# Patient Record
Sex: Female | Born: 1985 | Race: Black or African American | Hispanic: No | Marital: Single | Smoking: Never smoker
Health system: Southern US, Community
[De-identification: ages and names within clinical notes are randomized; demographics above are authoritative.]

## PROBLEM LIST (undated history)

## (undated) ENCOUNTER — Inpatient Hospital Stay (HOSPITAL_COMMUNITY): Payer: Self-pay

## (undated) DIAGNOSIS — Z789 Other specified health status: Secondary | ICD-10-CM

## (undated) HISTORY — PX: NO PAST SURGERIES: SHX2092

---

## 2007-05-27 ENCOUNTER — Inpatient Hospital Stay (HOSPITAL_COMMUNITY): Admission: AD | Admit: 2007-05-27 | Discharge: 2007-05-27 | Payer: Self-pay | Admitting: Obstetrics

## 2007-06-20 ENCOUNTER — Inpatient Hospital Stay (HOSPITAL_COMMUNITY): Admission: AD | Admit: 2007-06-20 | Discharge: 2007-06-20 | Payer: Self-pay | Admitting: Obstetrics

## 2007-07-26 ENCOUNTER — Inpatient Hospital Stay (HOSPITAL_COMMUNITY): Admission: RE | Admit: 2007-07-26 | Discharge: 2007-07-28 | Payer: Self-pay | Admitting: Obstetrics

## 2007-12-16 ENCOUNTER — Emergency Department (HOSPITAL_COMMUNITY): Admission: EM | Admit: 2007-12-16 | Discharge: 2007-12-16 | Payer: Self-pay | Admitting: Family Medicine

## 2008-01-23 ENCOUNTER — Emergency Department (HOSPITAL_COMMUNITY): Admission: EM | Admit: 2008-01-23 | Discharge: 2008-01-23 | Payer: Self-pay | Admitting: Emergency Medicine

## 2008-01-27 ENCOUNTER — Emergency Department (HOSPITAL_COMMUNITY): Admission: EM | Admit: 2008-01-27 | Discharge: 2008-01-27 | Payer: Self-pay | Admitting: Emergency Medicine

## 2011-01-06 ENCOUNTER — Inpatient Hospital Stay (INDEPENDENT_AMBULATORY_CARE_PROVIDER_SITE_OTHER)
Admission: RE | Admit: 2011-01-06 | Discharge: 2011-01-06 | Disposition: A | Discharge status: Home / Self Care | Payer: Self-pay | Source: Ambulatory Visit | Attending: Family Medicine | Admitting: Family Medicine

## 2011-01-06 DIAGNOSIS — H00019 Hordeolum externum unspecified eye, unspecified eyelid: Secondary | ICD-10-CM

## 2011-02-27 LAB — URINALYSIS, ROUTINE W REFLEX MICROSCOPIC
Ketones, ur: NEGATIVE
Nitrite: NEGATIVE
Specific Gravity, Urine: >1.03 — ABNORMAL HIGH

## 2011-02-28 LAB — CBC
HCT: 34.9 — ABNORMAL LOW
Hemoglobin: 12.1
Platelets: 184
RBC: 4.03
RDW: 14.3
RDW: 14.6
WBC: 10
WBC: 10.9 — ABNORMAL HIGH

## 2011-02-28 LAB — RPR: RPR Ser Ql: NONREACTIVE

## 2011-03-14 LAB — URINALYSIS, ROUTINE W REFLEX MICROSCOPIC
Bilirubin Urine: NEGATIVE
Protein, ur: NEGATIVE
pH: 7.5

## 2011-03-14 LAB — DIFFERENTIAL
Basophils Relative: 1
Lymphs Abs: 1.6
Monocytes Absolute: 0.9
Monocytes Relative: 9
Neutro Abs: 6.7

## 2011-03-14 LAB — CBC
HCT: 31.6 — ABNORMAL LOW
MCV: 89
Platelets: 188
RDW: 14.9

## 2012-07-18 DIAGNOSIS — O2390 Unspecified genitourinary tract infection in pregnancy, unspecified trimester: Secondary | ICD-10-CM | POA: Insufficient documentation

## 2012-07-18 DIAGNOSIS — O9989 Other specified diseases and conditions complicating pregnancy, childbirth and the puerperium: Secondary | ICD-10-CM | POA: Insufficient documentation

## 2012-07-18 DIAGNOSIS — O039 Complete or unspecified spontaneous abortion without complication: Secondary | ICD-10-CM | POA: Insufficient documentation

## 2012-07-18 DIAGNOSIS — R109 Unspecified abdominal pain: Secondary | ICD-10-CM | POA: Insufficient documentation

## 2012-07-18 DIAGNOSIS — A599 Trichomoniasis, unspecified: Secondary | ICD-10-CM | POA: Insufficient documentation

## 2012-07-19 ENCOUNTER — Emergency Department (HOSPITAL_COMMUNITY)
Admission: EM | Admit: 2012-07-19 | Discharge: 2012-07-19 | Disposition: A | Discharge status: Home / Self Care | Payer: Managed Care, Other (non HMO) | Attending: Emergency Medicine | Admitting: Emergency Medicine

## 2012-07-19 DIAGNOSIS — A599 Trichomoniasis, unspecified: Secondary | ICD-10-CM

## 2012-07-19 LAB — COMPREHENSIVE METABOLIC PANEL
ALT: 8 U/L (ref 0–35)
BUN: 8 mg/dL (ref 6–23)
Chloride: 103 mEq/L (ref 96–112)
Creatinine, Ser: 0.76 mg/dL (ref 0.50–1.10)
GFR calc non Af Amer: >90 mL/min (ref >90)
Glucose, Bld: 106 mg/dL — ABNORMAL HIGH (ref 70–99)
Sodium: 136 mEq/L (ref 135–145)
Total Bilirubin: 0.2 mg/dL — ABNORMAL LOW (ref 0.3–1.2)
Total Protein: 7.3 g/dL (ref 6.0–8.3)

## 2012-07-19 LAB — WET PREP, GENITAL

## 2012-07-19 LAB — CBC WITH DIFFERENTIAL/PLATELET
Eosinophils Absolute: 0.2 10*3/uL (ref 0.0–0.7)
HCT: 38.8 % (ref 36.0–46.0)
Lymphs Abs: 2.8 10*3/uL (ref 0.7–4.0)
MCHC: 34 g/dL (ref 30.0–36.0)
MCV: 82 fL (ref 78.0–100.0)
Monocytes Relative: 8 % (ref 3–12)
Neutro Abs: 6.3 10*3/uL (ref 1.7–7.7)

## 2012-07-19 LAB — URINALYSIS, ROUTINE W REFLEX MICROSCOPIC: Protein, ur: 30 mg/dL — AB

## 2012-07-19 LAB — URINE MICROSCOPIC-ADD ON

## 2012-07-19 LAB — LIPASE, BLOOD: Lipase: 24 U/L (ref 11–59)

## 2012-07-19 MED ORDER — METRONIDAZOLE 500 MG PO TABS: 500.0000 mg | ORAL_TABLET | Freq: Two times a day (BID) | ORAL | Status: DC

## 2012-07-19 NOTE — ED Notes (Signed)
Pelvic cart set up 

## 2012-07-19 NOTE — Discharge Instructions (Signed)
Incomplete Miscarriage Miscarriages in pregnancy are common. A miscarriage is a pregnancy that has ended before the twentieth week. You have had an incomplete miscarriage. Partial parts of the fetus or placenta (afterbirth) remain behind. Sometimes further treatment is needed. The most common reason for further treatment is continued bleeding (hemorrhage). Tissue left behind may also become infected. Treatment usually is curettage. Curettage for an incomplete abortion is a procedure in which the remaining products of pregnancy are removed. This can be done by a simple sucking procedure (suction curettage). It can also be done by a simple scraping (curettage) of the inside of the uterus (womb). This may be done in the hospital or in the caregiver's office. This is only done when your caregiver knows the pregnancy has ended. This is determined by physical examination and a negative pregnancy test. It may also include an ultrasound to confirm a dead fetus. The ultrasound may also prove that products of the pregnancy remain in the uterus. If your cervix remains dilated and you are still passing clots and tissue, your caregiver may wish to watch you for a little while. Your caregiver may want to see if you are going to finish passing all of the remaining parts of the pregnancy. If the bleeding continues, they may proceed with curettage. WHY DO I FEEL THIS WAY Miscarriages can be a very emotional time for prospective mothers. This is not you or your partner's fault. The miscarriage did not occur because of a lack in you or your partner. Nearly all miscarriages occur because the pregnancy has started off wrongly. At least half of miscarried pregnancies have a chromosomal abnormality (almost always not inherited). Others may have developmental problems with the fetus or placentas. Problems may not show up even when the products miscarried are studied under the microscope. You can usually begin trying for another  pregnancy as soon as your caregiver says it's okay. HOME CARE INSTRUCTIONS   Your caregiver may order bed rest (this means only getting up to use the bathroom). Your caregiver may allow you to continue light activity. If curettage was not done at this time, but you require further treatment.  Keep track of the number of pads you use each day. Keep track of how saturated (soaked) they are. Record this information.  Do not use tampons. Do not douche or have sexual intercourse until approved by your caregiver.  It is very important to keep all follow-up appointments for re-evaluation and continuing management.  Women who have an Rh negative blood type (ie, A, B, AB, or O negative) need to receive a drug called Rh(D) immune globulin. This medicine helps protect future fetuses against problems that can occur if an Rh negative mother is carrying a baby who is Rh positive. SEEK IMMEDIATE MEDICAL CARE IF:   You experience severe cramps in your stomach, back, or abdomen.  You run an unexplained temperature (record these).  You pass large clots or tissue (save any tissue for your caregiver to inspect).  Your bleeding increases or you become light-headed, weak, or have fainting episodes. MAKE SURE YOU:   Understand these instructions.  Will watch your condition.  Will get help right away if you are not doing well or get worse. Document Released: 05/26/2005 Document Revised: 08/18/2011 Document Reviewed: 01/14/2008 [facility] Patient Information 2013 [place], [place].  Trichomoniasis Trichomoniasis is an infection, caused by the Trichomonas organism, that affects both women and men. In women, the outer female genitalia and the vagina are affected. In men, the penis  is mainly affected, but the prostate and other reproductive organs can also be involved. Trichomoniasis is a sexually transmitted disease (STD) and is most often passed to another person through sexual contact. The majority of people who  get trichomoniasis do so from a sexual encounter and are also at risk for other STDs. CAUSES   Sexual intercourse with an infected partner.  It can be present in swimming pools or hot tubs. SYMPTOMS   Abnormal gray-green frothy vaginal discharge in women.  Vaginal itching and irritation in women.  Itching and irritation of the area outside the vagina in women.  Penile discharge with or without pain in males.  Inflammation of the urethra (urethritis), causing painful urination.  Bleeding after sexual intercourse. RELATED COMPLICATIONS  Pelvic inflammatory disease.  Infection of the uterus (endometritis).  Infertility.  Tubal (ectopic) pregnancy.  It can be associated with other STDs, including gonorrhea and chlamydia, hepatitis B, and HIV. COMPLICATIONS DURING PREGNANCY  Early (premature) delivery.  Premature rupture of the membranes (PROM).  Low birth weight. DIAGNOSIS   Visualization of Trichomonas under the microscope from the vagina discharge.  Ph of the vagina greater than 4.5, tested with a test tape.  Trich Rapid Test.  Culture of the organism, but this is not usually needed.  It may be found on a Pap test.  Having a "strawberry cervix,"which means the cervix looks very red like a strawberry. TREATMENT   You may be given medication to fight the infection. Inform your caregiver if you could be or are pregnant. Some medications used to treat the infection should not be taken during pregnancy.  Over-the-counter medications or creams to decrease itching or irritation may be recommended.  Your sexual partner will need to be treated if infected. HOME CARE INSTRUCTIONS   Take all medication prescribed by your caregiver.  Take over-the-counter medication for itching or irritation as directed by your caregiver.  Do not have sexual intercourse while you have the infection.  Do not douche or wear tampons.  Discuss your infection with your partner, as your  partner may have acquired the infection from you. Or, your partner may have been the person who transmitted the infection to you.  Have your sex partner examined and treated if necessary.  Practice safe, informed, and protected sex.  See your caregiver for other STD testing. SEEK MEDICAL CARE IF:   You still have symptoms after you finish the medication.  You have an oral temperature above 102 F (38.9 C).  You develop belly (abdominal) pain.  You have pain when you urinate.  You have bleeding after sexual intercourse.  You develop a rash.  The medication makes you sick or makes you throw up (vomit). Document Released: 11/19/2000 Document Revised: 08/18/2011 Document Reviewed: 12/15/2008 [facility] Patient Information 2013 [place], [place].

## 2012-07-19 NOTE — ED Provider Notes (Signed)
History     CSN: [id number]  Arrival date & time 07/18/12  2358   First MD Initiated Contact with Patient 07/19/12 0105      Chief Complaint  Patient presents with  . Vaginal Bleeding    (Consider location/radiation/quality/duration/timing/severity/associated sxs/prior treatment) HPI Michelle Fox is a 27 y.o. female who presents with vaginal bleeding and some abdominal discomfort described as cramping, and the lower right and left quadrant and suprapubic region, 3/10 (she thinks it was gas and since moved, no nausea, no vomiting, no diarrhea, no fevers or chills. Patient's last menstrual period was 1/221/02/26/2013, patient did take a pregnancy test that was positive a week ago.  Patient denies passing any clots or tissue. Patient has just used one pad. Patient is a G2 P2 A0 and is engaged.  No other sexual partners. No chest pain or shortness of breath.  No past medical history on file.  No past surgical history on file.  No family history on file.  History  Substance Use Topics  . Smoking status: Not on file  . Smokeless tobacco: Not on file  . Alcohol Use: Not on file    OB History   No data available      Review of Systems At least 10pt or greater review of systems completed and are negative except where specified in the HPI.  Allergies  Review of patient's allergies indicates no known allergies.  Home Medications  No current outpatient prescriptions on file.  BP 135/76  Pulse 97  Temp(Src) 98.3 F (36.8 C) (Oral)  Resp 18  SpO2 97%  Physical Exam  Nursing notes reviewed.  Electronic medical record reviewed. VITAL SIGNS:   Filed Vitals:   07/19/12 0001  BP: 135/76  Pulse: 97  Temp: 98.3 F (36.8 C)  TempSrc: Oral  Resp: 18  SpO2: 97%   CONSTITUTIONAL: Awake, oriented, appears non-toxic HENT: Atraumatic, normocephalic, oral mucosa pink and moist, airway patent. Nares patent without drainage. External ears normal. EYES: Conjunctiva clear, EOMI,  PERRLA NECK: Trachea midline, non-tender, supple CARDIOVASCULAR: Normal heart rate, Normal rhythm, No murmurs, rubs, gallops PULMONARY/CHEST: Clear to auscultation, no rhonchi, wheezes, or rales. Symmetrical breath sounds. Non-tender. ABDOMINAL: Non-distended, soft, non-tender - no rebound or guarding.  BS normal. NEUROLOGIC: Non-focal, moving all four extremities, no gross sensory or motor deficits. EXTREMITIES: No clubbing, cyanosis, or edema SKIN: Warm, Dry, No erythema, No rash PELVIC EXAM: normal external genitalia, vulva, dark red blood pooling in the vault coming from an open os, there is tissue and clots, cervix multiparous and os is open, uterus and adnexa are normal-no cervical motion tenderness. ED Course  Procedures (including critical care time)  Labs Reviewed  WET PREP, GENITAL - Abnormal; Notable for the following:    Trich, Wet Prep FEW (*)    Clue Cells Wet Prep HPF POC FEW (*)    WBC, Wet Prep HPF POC FEW (*)    All other components within normal limits  COMPREHENSIVE METABOLIC PANEL - Abnormal; Notable for the following:    Glucose, Bld 106 (*)    Albumin 3.3 (*)    Total Bilirubin 0.2 (*)    All other components within normal limits  URINALYSIS, ROUTINE W REFLEX MICROSCOPIC - Abnormal; Notable for the following:    Color, Urine RED (*)    APPearance TURBID (*)    Hgb urine dipstick LARGE (*)    Bilirubin Urine SMALL (*)    Ketones, ur 15 (*)    Protein, ur 30 (*)  Leukocytes, UA MODERATE (*)    All other components within normal limits  URINE MICROSCOPIC-ADD ON - Abnormal; Notable for the following:    Squamous Epithelial / LPF FEW (*)    Bacteria, UA FEW (*)    All other components within normal limits  HCG, QUANTITATIVE, PREGNANCY - Abnormal; Notable for the following:    hCG, Beta Chain, Quant, S 1425 (*)    All other components within normal limits  URINE CULTURE  GC/CHLAMYDIA PROBE AMP  CBC WITH DIFFERENTIAL  LIPASE, BLOOD   No results  found.   1. Inevitable abortion   2. Trichomonas       MDM  Michelle Fox is a 27 y.o. female presents with an inevitable abortion. Wet prep shows patient is positive for Trichomonas as well, give these results to the patient, will place her on metronidazole and have her followup with her obstetrician Dr. Gaynell Face, she already has an appointment on Tuesday since she found out she was pregnant.  Patient is urged to return to the emergency department for any shortness of breath, uncontrolled bleeding, bleeding is bright red or soaking more than 3 pads an hour. The patient understands and accepts the medical plan as it's been dictated and I have answered their questions. Discharge instructions concerning home care and prescriptions have been given.  The patient is STABLE and is discharged to home in good condition.   07/19/2012 7:37 AM Did not obtain ABO and Rh while the patient was in the emergency department. Discussed this with Dr. Jolayne Panther at [facility], we do have some time due to low antigen levels at 5 weeks and 3 days, RhoGAM can be given within the next week. I left a message with the patient's voice mail and have sent an e-mail to Dr. Gaynell Face to inform both of them that in ABO and Rh will need to be drawn and unless her ABO and Rh type is already in the [facility] records that is not available to Epic.               Jones Skene, MD 07/19/12 4540

## 2012-07-19 NOTE — ED Notes (Signed)
Pt alert, NAD, calm, interactive, skin W&D, resps e/u, speaking in clear complete sentences, using cell phone during interaction, polite smiling, reports only mild RLQ pain, here for vaginal bleeding, "has been spotting", took home pregnancy test on Tuesday ("+ pregnant"), felt a gush of blood flow around 1900, "has not filled even one liner yet", (denies: dizziness, nv, vag d/c, itching or uti sx), also reports "some feeling cold, some back pain, and hematuria", states, "last week urine was strong".

## 2012-07-19 NOTE — ED Notes (Signed)
EDP at BS 

## 2012-07-19 NOTE — ED Notes (Signed)
Has OBGYN appointment next week.

## 2012-07-19 NOTE — ED Notes (Signed)
Pt found out she was pregnant last week today she used the toilet and filled it with blood.

## 2012-07-20 LAB — URINE CULTURE

## 2012-07-20 LAB — GC/CHLAMYDIA PROBE AMP: CT Probe RNA: NEGATIVE

## 2012-07-25 ENCOUNTER — Telehealth (HOSPITAL_COMMUNITY): Payer: Self-pay | Admitting: Emergency Medicine

## 2012-07-25 NOTE — ED Notes (Signed)
Chart returned from EDP office. Per Burgess Amor PA-C, less than 100,000 colonies--no indication for tx.

## 2012-09-05 ENCOUNTER — Emergency Department (HOSPITAL_BASED_OUTPATIENT_CLINIC_OR_DEPARTMENT_OTHER)
Admission: EM | Admit: 2012-09-05 | Discharge: 2012-09-05 | Disposition: A | Discharge status: Home / Self Care | Payer: Managed Care, Other (non HMO) | Attending: Emergency Medicine | Admitting: Emergency Medicine

## 2012-09-05 ENCOUNTER — Encounter (HOSPITAL_BASED_OUTPATIENT_CLINIC_OR_DEPARTMENT_OTHER): Payer: Self-pay | Admitting: *Deleted

## 2012-09-05 DIAGNOSIS — K0889 Other specified disorders of teeth and supporting structures: Secondary | ICD-10-CM

## 2012-09-05 DIAGNOSIS — R51 Headache: Secondary | ICD-10-CM | POA: Insufficient documentation

## 2012-09-05 DIAGNOSIS — K089 Disorder of teeth and supporting structures, unspecified: Secondary | ICD-10-CM | POA: Insufficient documentation

## 2012-09-05 DIAGNOSIS — H9209 Otalgia, unspecified ear: Secondary | ICD-10-CM | POA: Insufficient documentation

## 2012-09-05 MED ORDER — AMOXICILLIN 500 MG PO CAPS: 500.0000 mg | ORAL_CAPSULE | Freq: Three times a day (TID) | ORAL | Status: DC

## 2012-09-05 MED ORDER — TRAMADOL HCL 50 MG PO TABS: 50.0000 mg | ORAL_TABLET | Freq: Four times a day (QID) | ORAL | Status: DC | PRN

## 2012-09-05 NOTE — ED Provider Notes (Signed)
History    This chart was scribed for Gilda Crease, MD by Leone Payor, ED Scribe. This patient was seen in room MH04/MH04 and the patient's care was started at 1458.   CSN: [id number]  Arrival date & time 09/05/12  1458   First MD Initiated Contact with Patient 09/05/12 1519      Chief Complaint  Patient presents with  . Dental Pain     The history is provided by the patient. No language interpreter was used.    Michelle Fox is a 27 y.o. female who presents to the Emergency Department complaining of constant, gradually worsening right sided dental and facial pain onset 2 days ago. She has associated right sided ear pain as well. Pt states she no longer has a dentist and needs a referral. Pt is an occasional alcohol user but denies smoking.   History reviewed. No pertinent past medical history.  History reviewed. No pertinent past surgical history.  History reviewed. No pertinent family history.  History  Substance Use Topics  . Smoking status: Never Smoker   . Smokeless tobacco: Not on file  . Alcohol Use: Yes    OB History   Grav Para Term Preterm Abortions TAB SAB Ect Mult Living                  Review of Systems  HENT: Positive for ear pain and dental problem.   All other systems reviewed and are negative.    Allergies  Review of patient's allergies indicates no known allergies.  Home Medications   Current Outpatient Rx  Name  Route  Sig  Dispense  Refill  . metroNIDAZOLE (FLAGYL) 500 MG tablet   Oral   Take 1 tablet (500 mg total) by mouth 2 (two) times daily.   14 tablet   0     BP 121/76  Pulse 68  Temp(Src) 99.2 F (37.3 C) (Oral)  Resp 18  Ht 5\' 6"  (1.676 m)  Wt 175 lb (79.379 kg)  BMI 28.26 kg/m2  SpO2 98%  LMP 08/22/2012  Physical Exam  Nursing note and vitals reviewed. Constitutional: She is oriented to person, place, and time. She appears well-developed and well-nourished. No distress.  HENT:  Head: Normocephalic and  atraumatic.  Eyes: EOM are normal.  Neck: Neck supple. No tracheal deviation present.  Cardiovascular: Normal rate.   Pulmonary/Chest: Effort normal. No respiratory distress.  Musculoskeletal: Normal range of motion.  Neurological: She is alert and oriented to person, place, and time.  Skin: Skin is warm and dry.  Psychiatric: She has a normal mood and affect. Her behavior is normal.    ED Course  Procedures (including critical care time)  DIAGNOSTIC STUDIES: Oxygen Saturation is 98% on room air, normal by my interpretation.    COORDINATION OF CARE: 3:27 PMDiscussed treatment plan with pt at bedside and pt agreed to plan.    Labs Reviewed - No data to display No results found.   Diagnosis: Toothache    MDM  Patient presents to the ER for evaluation of a toothache. She has been healthy without signs of abscess. Treated with tramadol and amoxicillin. Follow up with dentist.  I personally performed the services described in this documentation, which was scribed in my presence. The recorded information has been reviewed and is accurate.       Gilda Crease, MD 09/07/12 [phone]

## 2012-09-05 NOTE — ED Notes (Signed)
Pt c/o right side dental and facial pain onset Friday night.

## 2012-10-04 ENCOUNTER — Emergency Department (HOSPITAL_BASED_OUTPATIENT_CLINIC_OR_DEPARTMENT_OTHER)
Admission: EM | Admit: 2012-10-04 | Discharge: 2012-10-04 | Disposition: A | Discharge status: Home / Self Care | Payer: Managed Care, Other (non HMO) | Attending: Emergency Medicine | Admitting: Emergency Medicine

## 2012-10-04 ENCOUNTER — Encounter (HOSPITAL_BASED_OUTPATIENT_CLINIC_OR_DEPARTMENT_OTHER): Payer: Self-pay | Admitting: *Deleted

## 2012-10-04 DIAGNOSIS — Z79899 Other long term (current) drug therapy: Secondary | ICD-10-CM | POA: Insufficient documentation

## 2012-10-04 DIAGNOSIS — R509 Fever, unspecified: Secondary | ICD-10-CM | POA: Insufficient documentation

## 2012-10-04 DIAGNOSIS — R197 Diarrhea, unspecified: Secondary | ICD-10-CM | POA: Insufficient documentation

## 2012-10-04 DIAGNOSIS — O210 Mild hyperemesis gravidarum: Secondary | ICD-10-CM | POA: Insufficient documentation

## 2012-10-04 DIAGNOSIS — O9989 Other specified diseases and conditions complicating pregnancy, childbirth and the puerperium: Secondary | ICD-10-CM | POA: Insufficient documentation

## 2012-10-04 DIAGNOSIS — O219 Vomiting of pregnancy, unspecified: Secondary | ICD-10-CM

## 2012-10-04 LAB — COMPREHENSIVE METABOLIC PANEL
ALT: 8 U/L (ref 0–35)
Calcium: 9.3 mg/dL (ref 8.4–10.5)
GFR calc Af Amer: >90 mL/min (ref >90)
Glucose, Bld: 99 mg/dL (ref 70–99)
Sodium: 133 mEq/L — ABNORMAL LOW (ref 135–145)
Total Protein: 7.5 g/dL (ref 6.0–8.3)

## 2012-10-04 LAB — CBC WITH DIFFERENTIAL/PLATELET
Basophils Absolute: 0 10*3/uL (ref 0.0–0.1)
Eosinophils Absolute: 0 10*3/uL (ref 0.0–0.7)
Eosinophils Relative: 0 % (ref 0–5)
Lymphs Abs: 1 10*3/uL (ref 0.7–4.0)
MCH: 28.3 pg (ref 26.0–34.0)
MCV: 80.7 fL (ref 78.0–100.0)
Platelets: 267 10*3/uL (ref 150–400)
RDW: 13.6 % (ref 11.5–15.5)

## 2012-10-04 LAB — URINALYSIS, ROUTINE W REFLEX MICROSCOPIC
Bilirubin Urine: NEGATIVE
Glucose, UA: NEGATIVE mg/dL
Ketones, ur: NEGATIVE mg/dL
Urobilinogen, UA: 1 mg/dL (ref 0.0–1.0)

## 2012-10-04 LAB — URINE MICROSCOPIC-ADD ON

## 2012-10-04 MED ORDER — NITROFURANTOIN MONOHYD MACRO 100 MG PO CAPS: 100.0000 mg | ORAL_CAPSULE | Freq: Two times a day (BID) | ORAL | Status: DC

## 2012-10-04 MED ORDER — ONDANSETRON HCL 4 MG/2ML IJ SOLN
4.0000 mg | Freq: Once | INTRAMUSCULAR | Status: AC
  Administered 2012-10-04: 4 mg via INTRAVENOUS
  Filled 2012-10-04: qty 2

## 2012-10-04 MED ORDER — ONDANSETRON HCL 4 MG PO TABS: 4.0000 mg | ORAL_TABLET | Freq: Four times a day (QID) | ORAL | Status: DC

## 2012-10-04 NOTE — ED Notes (Signed)
N/v/d, chills since 10/01/12.  C/o feeling weak and lower abd pain.  Pt. States she is pregnant.  Took a home pregnancy test and has first OB appt Oct 08, 2012.  LMP 08/22/12.  Denies vaginal bleeding or discharge.  No meds taken PTA.

## 2012-10-04 NOTE — ED Notes (Signed)
Pt states she feels much better and tolerated fluids well.

## 2012-10-04 NOTE — ED Provider Notes (Signed)
History  This chart was scribed for Michelle Chick, MD by Shari Heritage, ED Scribe. The patient was seen in room MH11/MH11. Patient's care was started at 2152.   CSN: [id number]  Arrival date & time 10/04/12  1925   First MD Initiated Contact with Patient 10/04/12 2152      Chief Complaint  Patient presents with  . Emesis     Patient is a 27 y.o. female presenting with vomiting. The history is provided by the patient. No language interpreter was used.  Emesis Severity:  Moderate Number of daily episodes:  6 Quality:  Stomach contents Feeding tolerance: intolerant. Progression:  Worsening Chronicity:  New Associated symptoms: chills, diarrhea and fever   Associated symptoms: no abdominal pain and no URI   Diarrhea:    Quality:  Watery   Number of occurrences:  10   Duration:  3 days   Timing:  Constant   Progression:  Worsening Fever:    Max temp PTA (F):  101 Risk factors: pregnant now      HPI Comments: Michelle Fox is a 27 y.o. female who is [redacted] weeks pregnant and presents to the Emergency Department complaining of non-bloody, emesis and non-bloody, diarrhea for the past 3 days. Patient states that symptoms worsened today. She reports 10 episodes of diarrhea today and 5-6 episodes of vomiting. Patient is intolerant of liquids and solids. There is associated fever, chills and nausea. Tmax at home was 101. LNMP was 08/22/2012. Patient says that she has an initial OB appointment scheduled for May 6. Patient denies any vaginal bleeding, vaginal discharge, pelvic pain, abdominal pain or any other symptoms at this time. She reports no other pertinent past medical history.   History reviewed. No pertinent past medical history.  History reviewed. No pertinent past surgical history.  No family history on file.  History  Substance Use Topics  . Smoking status: Never Smoker   . Smokeless tobacco: Not on file  . Alcohol Use: No    OB History   Grav Para Term Preterm Abortions  TAB SAB Ect Mult Living   1               Review of Systems  Constitutional: Positive for chills. Negative for fever.  Gastrointestinal: Positive for nausea, vomiting and diarrhea. Negative for abdominal pain and blood in stool.  Genitourinary: Negative for vaginal discharge and pelvic pain.  All other systems reviewed and are negative.    Allergies  Review of patient's allergies indicates no known allergies.  Home Medications   Current Outpatient Rx  Name  Route  Sig  Dispense  Refill  . amoxicillin (AMOXIL) 500 MG capsule   Oral   Take 1 capsule (500 mg total) by mouth 3 (three) times daily.   30 capsule   0   . metroNIDAZOLE (FLAGYL) 500 MG tablet   Oral   Take 1 tablet (500 mg total) by mouth 2 (two) times daily.   14 tablet   0   . nitrofurantoin, macrocrystal-monohydrate, (MACROBID) 100 MG capsule   Oral   Take 1 capsule (100 mg total) by mouth 2 (two) times daily.   10 capsule   0   . ondansetron (ZOFRAN) 4 MG tablet   Oral   Take 1 tablet (4 mg total) by mouth every 6 (six) hours.   12 tablet   0   . traMADol (ULTRAM) 50 MG tablet   Oral   Take 1 tablet (50 mg total) by mouth every  6 (six) hours as needed for pain.   15 tablet   0     Triage Vitals: BP 114/76  Pulse 96  Temp(Src) 98.7 F (37.1 C) (Oral)  Resp 18  Wt 175 lb (79.379 kg)  BMI 28.26 kg/m2  SpO2 98%  LMP 08/22/2012  Physical Exam  Constitutional: She is oriented to person, place, and time. She appears well-developed and well-nourished. No distress.  HENT:  Head: Normocephalic and atraumatic.  Mouth/Throat: Oropharynx is clear and moist.  Appears well hydrated. Moist mucous membranes.  Eyes: Conjunctivae and EOM are normal. Pupils are equal, round, and reactive to light.  Neck: Normal range of motion. Neck supple.  Cardiovascular: Normal rate and regular rhythm.   Pulmonary/Chest: Effort normal and breath sounds normal. No respiratory distress.  Abdominal: Soft. Bowel sounds  are normal. There is no tenderness.  Neurological: She is alert and oriented to person, place, and time.  Skin: Skin is warm and dry.  Brisk capillary refills.    ED Course  Procedures (including critical care time) DIAGNOSTIC STUDIES: Oxygen Saturation is 98% on room air, normal by my interpretation.    COORDINATION OF CARE: 10:09 PM- Patient informed of current plan for treatment and evaluation and agrees with plan at this time.    11:19 PM pt has tolerated po fluids and feels much improved.  She is requesting discharge.     Labs Reviewed  URINALYSIS, ROUTINE W REFLEX MICROSCOPIC - Abnormal; Notable for the following:    APPearance CLOUDY (*)    Leukocytes, UA SMALL (*)    All other components within normal limits  PREGNANCY, URINE - Abnormal; Notable for the following:    Preg Test, Ur POSITIVE (*)    All other components within normal limits  URINE MICROSCOPIC-ADD ON - Abnormal; Notable for the following:    Squamous Epithelial / LPF FEW (*)    Bacteria, UA MANY (*)    All other components within normal limits  CBC WITH DIFFERENTIAL - Abnormal; Notable for the following:    WBC 11.2 (*)    Neutrophils Relative 83 (*)    Neutro Abs 9.3 (*)    Lymphocytes Relative 9 (*)    All other components within normal limits  COMPREHENSIVE METABOLIC PANEL - Abnormal; Notable for the following:    Sodium 133 (*)    All other components within normal limits  URINE CULTURE    No results found.   1. Nausea/vomiting in pregnancy   2. Diarrhea       MDM  Pt presents with c/o vomiting and diarrhea over the past several days, she is approx [redacted] weeks pregnant- no abdominal pain or vaginal bleeding to suggest ectopic pregnancy.  Abdominal exam is benign.  Pt appears nontoxic and well hydrated.  Pt received IV hydration and zofran, she feels much improved after zofran and has tolerated po trial.  Started on macrobid due to concern for UTI, urine culture sent. She has no CVA tenderness  to suggest pyelonephritis.  She has appoitment with OB in 3 days.  Discharged with strict return precautions.  Pt agreeable with plan.     I personally performed the services described in this documentation, which was scribed in my presence. The recorded information has been reviewed and is accurate.    Michelle Chick, MD 10/04/12 [phone]

## 2012-10-04 NOTE — ED Notes (Signed)
Diarrhea, vomiting and chills. [redacted] weeks pregnant. Goes for her OB appointment with Dr Gaynell Face on Thursday.

## 2012-10-06 LAB — URINE CULTURE: Colony Count: NO GROWTH

## 2012-10-26 ENCOUNTER — Inpatient Hospital Stay (HOSPITAL_COMMUNITY)
Admission: AD | Admit: 2012-10-26 | Discharge: 2012-10-26 | Disposition: A | Discharge status: Home / Self Care | Payer: Managed Care, Other (non HMO) | Source: Ambulatory Visit | Attending: Obstetrics | Admitting: Obstetrics

## 2012-10-26 NOTE — MAU Note (Signed)
Per Selena Batten in admitting patient came up after registering stating our wait is too long left to go to [facility] or [facility].

## 2012-12-18 ENCOUNTER — Encounter (HOSPITAL_COMMUNITY): Payer: Self-pay | Admitting: *Deleted

## 2012-12-18 ENCOUNTER — Emergency Department (INDEPENDENT_AMBULATORY_CARE_PROVIDER_SITE_OTHER)
Admission: EM | Admit: 2012-12-18 | Discharge: 2012-12-18 | Disposition: A | Discharge status: Home / Self Care | Payer: Managed Care, Other (non HMO) | Source: Home / Self Care

## 2012-12-18 ENCOUNTER — Emergency Department (INDEPENDENT_AMBULATORY_CARE_PROVIDER_SITE_OTHER): Payer: Managed Care, Other (non HMO)

## 2012-12-18 DIAGNOSIS — S93409A Sprain of unspecified ligament of unspecified ankle, initial encounter: Secondary | ICD-10-CM

## 2012-12-18 DIAGNOSIS — S93401A Sprain of unspecified ligament of right ankle, initial encounter: Secondary | ICD-10-CM

## 2012-12-18 NOTE — ED Provider Notes (Signed)
History    CSN: [id number] Arrival date & time 12/18/12  5784  First MD Initiated Contact with Patient 12/18/12 1844     Chief Complaint  Patient presents with  . Ankle Pain   (Consider location/radiation/quality/duration/timing/severity/associated sxs/prior Treatment) HPI Comments: 27 year old female who presents with swelling to the right  ankle for one week. [place] beach [place] and say she may have twisted the ankle at that time. Since that time she is continued to ambulate. Yesterday she was working out at [employer]  Her ankle is more swollen on the lateral aspect today. Denies pain to the foot or medial aspect of the ankle.  History reviewed. No pertinent past medical history. History reviewed. No pertinent past surgical history. No family history on file. History  Substance Use Topics  . Smoking status: Never Smoker   . Smokeless tobacco: Not on file  . Alcohol Use: No   OB History   Grav Para Term Preterm Abortions TAB SAB Ect Mult Living   1              Review of Systems  Constitutional: Negative for fever, chills and activity change.  HENT: Negative.   Respiratory: Negative.   Cardiovascular: Negative.   Musculoskeletal:       As per HPI  Skin: Negative for color change, pallor and rash.  Neurological: Negative.     Allergies  Review of patient's allergies indicates no known allergies.  Home Medications   Current Outpatient Rx  Name  Route  Sig  Dispense  Refill  . amoxicillin (AMOXIL) 500 MG capsule   Oral   Take 1 capsule (500 mg total) by mouth 3 (three) times daily.   30 capsule   0   . metroNIDAZOLE (FLAGYL) 500 MG tablet   Oral   Take 1 tablet (500 mg total) by mouth 2 (two) times daily.   14 tablet   0   . nitrofurantoin, macrocrystal-monohydrate, (MACROBID) 100 MG capsule   Oral   Take 1 capsule (100 mg total) by mouth 2 (two) times daily.   10 capsule   0   . ondansetron (ZOFRAN) 4 MG tablet   Oral   Take 1 tablet (4 mg  total) by mouth every 6 (six) hours.   12 tablet   0   . traMADol (ULTRAM) 50 MG tablet   Oral   Take 1 tablet (50 mg total) by mouth every 6 (six) hours as needed for pain.   15 tablet   0    BP 125/82  Pulse 83  Temp(Src) 98.5 F (36.9 C) (Oral)  Resp 16  SpO2 99%  LMP 12/18/2012  Breastfeeding? Unknown Physical Exam  Nursing note and vitals reviewed. Constitutional: She is oriented to person, place, and time. She appears well-developed and well-nourished. No distress.  HENT:  Head: Normocephalic and atraumatic.  Eyes: EOM are normal. Pupils are equal, round, and reactive to light.  Neck: Normal range of motion. Neck supple.  Musculoskeletal: Normal range of motion. She exhibits edema and tenderness.  Mild puffiness to the deltoid tendon of the right ankle. No tenderness to the medial or lateral malleolar. No tenderness to the foot. Range of motion of the ankle is complete. Distal neurovascular motor sensory is intact.  Lymphadenopathy:    She has no cervical adenopathy.  Neurological: She is alert and oriented to person, place, and time.  Skin: Skin is warm and dry.  Psychiatric: She has a normal mood and  affect.    ED Course  Procedures (including critical care time) Labs Reviewed - No data to display Dg Ankle Complete Right  12/18/2012   *RADIOLOGY REPORT*  Clinical Data: Right ankle pain and swelling since an injury 1 week ago.  RIGHT ANKLE - COMPLETE 3+ VIEW  Comparison: None.  Findings: Diffuse soft tissue swelling.  No fracture, dislocation or effusion seen.  IMPRESSION: No fracture.   Original Report Authenticated By: Beckie Salts, M.D.   1. Ankle sprain, right, initial encounter     MDM  RICE ASO wrap INstructions for ankle sprain.   Hayden Rasmussen, NP 12/18/12 1944

## 2012-12-18 NOTE — ED Notes (Signed)
Reports pain and swelling to right lateral ankle x 1 wk.  No known injury, but pt works out regularly and was at [employer] last week.  Has been soaking in epsom salts & green alcohol, taking ASA.

## 2012-12-18 NOTE — ED Provider Notes (Signed)
Medical screening examination/treatment/procedure(s) were performed by non-physician practitioner and as supervising physician I was immediately available for consultation/collaboration.  Liala Codispoti   Dore Oquin, MD 12/18/12 2041 

## 2013-10-28 ENCOUNTER — Encounter (HOSPITAL_COMMUNITY): Payer: Self-pay | Admitting: Emergency Medicine

## 2013-10-28 ENCOUNTER — Emergency Department (HOSPITAL_COMMUNITY)
Admission: EM | Admit: 2013-10-28 | Discharge: 2013-10-28 | Disposition: A | Discharge status: Home / Self Care | Payer: Managed Care, Other (non HMO) | Source: Home / Self Care | Attending: Emergency Medicine | Admitting: Emergency Medicine

## 2013-10-28 DIAGNOSIS — J309 Allergic rhinitis, unspecified: Secondary | ICD-10-CM

## 2013-10-28 MED ORDER — FLUCONAZOLE 150 MG PO TABS: 150.0000 mg | ORAL_TABLET | Freq: Once | ORAL | Status: DC

## 2013-10-28 MED ORDER — PREDNISONE 10 MG PO TABS: ORAL_TABLET | ORAL | Status: DC

## 2013-10-28 MED ORDER — CHLORPHENIRAMINE-PSE-IBUPROFEN 2-30-200 MG PO TABS: ORAL_TABLET | ORAL | Status: DC

## 2013-10-28 MED ORDER — DOXYCYCLINE HYCLATE 100 MG PO CAPS
100.0000 mg | ORAL_CAPSULE | Freq: Two times a day (BID) | ORAL | Status: DC
Start: 2013-10-28 — End: 2015-01-03

## 2013-10-28 MED ORDER — FLUTICASONE PROPIONATE 50 MCG/ACT NA SUSP: 2.0000 | Freq: Two times a day (BID) | NASAL | Status: DC

## 2013-10-28 MED ORDER — OLOPATADINE HCL 0.6 % NA SOLN: NASAL | Status: DC

## 2013-10-28 NOTE — Discharge Instructions (Signed)
Hay Fever Hay fever is an allergic reaction to particles in the air. It cannot be passed from person to person. It cannot be cured, but it can be controlled. CAUSES  Hay fever is caused by something that triggers an allergic reaction (allergens). The following are examples of allergens:  Ragweed.  Feathers.  Animal dander.  Grass and tree pollens.  Cigarette smoke.  House dust.  Pollution. SYMPTOMS   Sneezing.  Runny or stuffy nose.  Tearing eyes.  Itchy eyes, nose, mouth, throat, skin, or other area.  Sore throat.  Headache.  Decreased sense of smell or taste. DIAGNOSIS Your caregiver will perform a physical exam and ask questions about the symptoms you are having.Allergy testing may be done to determine exactly what triggers your hay fever.  TREATMENT   Over-the-counter medicines may help symptoms. These include:  Antihistamines.  Decongestants. These may help with nasal congestion.  Your caregiver may prescribe medicines if over-the-counter medicines do not work.  Some people benefit from allergy shots when other medicines are not helpful. HOME CARE INSTRUCTIONS   Avoid the allergen that is causing your symptoms, if possible.  Take all medicine as told by your caregiver. SEEK MEDICAL CARE IF:   You have severe allergy symptoms and your current medicines are not helping.  Your treatment was working at one time, but you are now experiencing symptoms.  You have sinus congestion and pressure.  You develop a fever or headache.  You have thick nasal discharge.  You have asthma and have a worsening cough and wheezing. SEEK IMMEDIATE MEDICAL CARE IF:   You have swelling of your tongue or lips.  You have trouble breathing.  You feel lightheaded or like you are going to faint.  You have cold sweats.  You have a fever. Document Released: 05/26/2005 Document Revised: 08/18/2011 Document Reviewed: 08/21/2010 [employer] Patient Information 2014  [employer]  

## 2013-10-28 NOTE — ED Provider Notes (Signed)
CSN: [id number]     Arrival date & time 10/28/13  1601 History   First MD Initiated Contact with Patient 10/28/13 1649     Chief Complaint  Patient presents with  . Facial Pain   (Consider location/radiation/quality/duration/timing/severity/associated sxs/prior Treatment) HPI Comments: 28 year old female presents complaining of possible sinus infection. For 3 weeks, she has nasal congestion, sneezing, and pressure around her nose. She has frequent sneezing. She says she has never been this congested before. She denies fever, chills, cough, sore throat. She has a history of seasonal allergies and says this might just be bad allergies. She is taking antihistamines without relief. No history of sinus infections.   History reviewed. No pertinent past medical history. History reviewed. No pertinent past surgical history. History reviewed. No pertinent family history. History  Substance Use Topics  . Smoking status: Never Smoker   . Smokeless tobacco: Not on file  . Alcohol Use: Yes   OB History   Grav Para Term Preterm Abortions TAB SAB Ect Mult Living   1              Review of Systems  Constitutional: Negative for fever, chills and fatigue.  HENT: Positive for congestion, ear pain, postnasal drip, rhinorrhea, sinus pressure and sneezing. Negative for sore throat.   Respiratory: Negative for cough.   All other systems reviewed and are negative.   Allergies  Review of patient's allergies indicates no known allergies.  Home Medications   Prior to Admission medications   Medication Sig Start Date End Date Taking? Authorizing Provider  amoxicillin (AMOXIL) 500 MG capsule Take 1 capsule (500 mg total) by mouth 3 (three) times daily. 09/05/12   Gilda Creasehristopher J. Pollina, MD  Chlorpheniramine-PSE-Ibuprofen (ADVIL ALLERGY SINUS) 2-30-200 MG TABS 1-2 tabs PO Q4-6 hrs PRN 10/28/13   Graylon GoodZachary H Jalyric Kaestner, PA-C  doxycycline (VIBRAMYCIN) 100 MG capsule Take 1 capsule (100 mg total) by mouth 2 (two)  times daily. 10/28/13   Graylon GoodZachary H Talynn Lebon, PA-C  fluconazole (DIFLUCAN) 150 MG tablet Take 1 tablet (150 mg total) by mouth once. Pick up the refill and and take second dose in 5 days if symptoms have not resolved 10/28/13   Graylon GoodZachary H Ioana Louks, PA-C  fluticasone (FLONASE) 50 MCG/ACT nasal spray Place 2 sprays into both nostrils 2 (two) times daily. Decrease to 2 sprays/nostril daily after 5 days 10/28/13   Graylon GoodZachary H Gaelan Glennon, PA-C  metroNIDAZOLE (FLAGYL) 500 MG tablet Take 1 tablet (500 mg total) by mouth 2 (two) times daily. 07/19/12   John-Adam Bonk, MD  nitrofurantoin, macrocrystal-monohydrate, (MACROBID) 100 MG capsule Take 1 capsule (100 mg total) by mouth 2 (two) times daily. 10/04/12   Ethelda ChickMartha K Linker, MD  Olopatadine HCl 0.6 % SOLN 2 sprays/nostril BID 10/28/13   Graylon GoodZachary H Michala Deblanc, PA-C  ondansetron (ZOFRAN) 4 MG tablet Take 1 tablet (4 mg total) by mouth every 6 (six) hours. 10/04/12   Ethelda ChickMartha K Linker, MD  predniSONE (DELTASONE) 10 MG tablet 4 tabs PO QD for 4 days; 3 tabs PO QD for 3 days; 2 tabs PO QD for 2 days; 1 tab PO QD for 1 day 10/28/13   Graylon GoodZachary H Jaskiran Pata, PA-C  traMADol (ULTRAM) 50 MG tablet Take 1 tablet (50 mg total) by mouth every 6 (six) hours as needed for pain. 09/05/12   Gilda Creasehristopher J. Pollina, MD   BP 134/79  Pulse 76  Temp(Src) 98.3 F (36.8 C) (Oral)  Resp 16  SpO2 100%  LMP 10/27/2013  Breastfeeding? No Physical Exam  Nursing note and vitals reviewed. Constitutional: She is oriented to person, place, and time. Vital signs are normal. She appears well-developed and well-nourished. No distress.  HENT:  Head: Normocephalic and atraumatic.  Right Ear: Tympanic membrane, external ear and ear canal normal.  Left Ear: Tympanic membrane, external ear and ear canal normal.  Nose: Mucosal edema and rhinorrhea present. Right sinus exhibits no maxillary sinus tenderness and no frontal sinus tenderness. Left sinus exhibits no maxillary sinus tenderness and no frontal sinus tenderness.   Mouth/Throat: Uvula is midline, oropharynx is clear and moist and mucous membranes are normal.  Neck: Normal range of motion. Neck supple.  Pulmonary/Chest: Effort normal. No respiratory distress.  Lymphadenopathy:    She has no cervical adenopathy.  Neurological: She is alert and oriented to person, place, and time. She has normal strength. Coordination normal.  Skin: Skin is warm and dry. No rash noted. She is not diaphoretic.  Psychiatric: She has a normal mood and affect. Judgment normal.    ED Course  Procedures (including critical care time) Labs Review Labs Reviewed - No data to display  Imaging Review No results found.   MDM   1. Allergic rhinosinusitis    Allergic rhinosinusitis versus low-grade bacterial sinusitis. Treating symptomatically for a few days, if no improvement she will start antibiotics. Followup as needed.  Meds ordered this encounter  Medications  . predniSONE (DELTASONE) 10 MG tablet    Sig: 4 tabs PO QD for 4 days; 3 tabs PO QD for 3 days; 2 tabs PO QD for 2 days; 1 tab PO QD for 1 day    Dispense:  30 tablet    Refill:  0    Order Specific Question:  Supervising Provider    Answer:  Linna Hoff [phone]  . fluticasone (FLONASE) 50 MCG/ACT nasal spray    Sig: Place 2 sprays into both nostrils 2 (two) times daily. Decrease to 2 sprays/nostril daily after 5 days    Dispense:  16 g    Refill:  2    Order Specific Question:  Supervising Provider    Answer:  Linna Hoff [phone]  . Olopatadine HCl 0.6 % SOLN    Sig: 2 sprays/nostril BID    Dispense:  1 Bottle    Refill:  1    Order Specific Question:  Supervising Provider    Answer:  Linna Hoff [phone]  . Chlorpheniramine-PSE-Ibuprofen (ADVIL ALLERGY SINUS) 2-30-200 MG TABS    Sig: 1-2 tabs PO Q4-6 hrs PRN    Dispense:  30 each    Refill:  1    Order Specific Question:  Supervising Provider    Answer:  Linna Hoff [phone]  . doxycycline (VIBRAMYCIN) 100 MG capsule    Sig: Take 1  capsule (100 mg total) by mouth 2 (two) times daily.    Dispense:  20 capsule    Refill:  0    Order Specific Question:  Supervising Provider    Answer:  Linna Hoff [phone]  . fluconazole (DIFLUCAN) 150 MG tablet    Sig: Take 1 tablet (150 mg total) by mouth once. Pick up the refill and and take second dose in 5 days if symptoms have not resolved    Dispense:  1 tablet    Refill:  1    Order Specific Question:  Supervising Provider    Answer:  Bradd Canary D [[id number]]       Graylon Good, PA-C 10/28/13 1659

## 2013-10-28 NOTE — ED Notes (Signed)
C/o sinus pressure and pain.  Nasal stuffiness.  Sneezing and headache.  No relief with otc meds.  Symptoms present x 3 wks.

## 2013-10-30 NOTE — ED Provider Notes (Signed)
Medical screening examination/treatment/procedure(s) were performed by non-physician practitioner and as supervising physician I was immediately available for consultation/collaboration.  Laressa Bolinger, M.D.  Kaylynne Andres C Holland Nickson, MD 10/30/13 0853 

## 2014-03-30 ENCOUNTER — Telehealth: Payer: Self-pay

## 2014-03-30 NOTE — Telephone Encounter (Signed)
Patient called stating she needs an appointment  Went to urgent care but was too crowded Call transferred to front staff to obtain an appointment

## 2014-04-10 ENCOUNTER — Encounter (HOSPITAL_COMMUNITY): Payer: Self-pay | Admitting: Emergency Medicine

## 2014-12-12 ENCOUNTER — Emergency Department (HOSPITAL_COMMUNITY)
Admission: EM | Admit: 2014-12-12 | Discharge: 2014-12-12 | Disposition: A | Discharge status: Home / Self Care | Payer: Managed Care, Other (non HMO) | Attending: Emergency Medicine | Admitting: Emergency Medicine

## 2014-12-12 ENCOUNTER — Encounter (HOSPITAL_COMMUNITY): Payer: Self-pay | Admitting: Emergency Medicine

## 2014-12-12 DIAGNOSIS — T148XXA Other injury of unspecified body region, initial encounter: Secondary | ICD-10-CM

## 2014-12-12 DIAGNOSIS — Z79899 Other long term (current) drug therapy: Secondary | ICD-10-CM | POA: Insufficient documentation

## 2014-12-12 DIAGNOSIS — Z792 Long term (current) use of antibiotics: Secondary | ICD-10-CM | POA: Insufficient documentation

## 2014-12-12 DIAGNOSIS — Y92410 Unspecified street and highway as the place of occurrence of the external cause: Secondary | ICD-10-CM | POA: Insufficient documentation

## 2014-12-12 DIAGNOSIS — M62830 Muscle spasm of back: Secondary | ICD-10-CM

## 2014-12-12 DIAGNOSIS — Y9389 Activity, other specified: Secondary | ICD-10-CM | POA: Insufficient documentation

## 2014-12-12 DIAGNOSIS — S39012A Strain of muscle, fascia and tendon of lower back, initial encounter: Secondary | ICD-10-CM | POA: Insufficient documentation

## 2014-12-12 DIAGNOSIS — Y998 Other external cause status: Secondary | ICD-10-CM | POA: Insufficient documentation

## 2014-12-12 DIAGNOSIS — Z7951 Long term (current) use of inhaled steroids: Secondary | ICD-10-CM | POA: Insufficient documentation

## 2014-12-12 MED ORDER — NAPROXEN 500 MG PO TABS: 500.0000 mg | ORAL_TABLET | Freq: Two times a day (BID) | ORAL | Status: DC

## 2014-12-12 MED ORDER — IBUPROFEN 400 MG PO TABS
800.0000 mg | ORAL_TABLET | Freq: Once | ORAL | Status: AC
  Administered 2014-12-12: 800 mg via ORAL
  Filled 2014-12-12: qty 2

## 2014-12-12 MED ORDER — CYCLOBENZAPRINE HCL 10 MG PO TABS: 10.0000 mg | ORAL_TABLET | Freq: Two times a day (BID) | ORAL | Status: DC | PRN
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
## 2014-12-12 NOTE — Discharge Instructions (Signed)
Do not take the muscle relaxant if driving as it will make you sleepy. Return as needed.

## 2015-01-03 ENCOUNTER — Emergency Department (INDEPENDENT_AMBULATORY_CARE_PROVIDER_SITE_OTHER)
Admission: EM | Admit: 2015-01-03 | Discharge: 2015-01-03 | Disposition: A | Discharge status: Home / Self Care | Payer: Self-pay | Source: Home / Self Care | Attending: Emergency Medicine | Admitting: Emergency Medicine

## 2015-01-03 ENCOUNTER — Other Ambulatory Visit (HOSPITAL_COMMUNITY)
Admission: RE | Admit: 2015-01-03 | Discharge: 2015-01-03 | Disposition: A | Discharge status: Home / Self Care | Payer: Managed Care, Other (non HMO) | Source: Ambulatory Visit | Attending: Emergency Medicine | Admitting: Emergency Medicine

## 2015-01-03 ENCOUNTER — Encounter (HOSPITAL_COMMUNITY): Payer: Self-pay | Admitting: Emergency Medicine

## 2015-01-03 DIAGNOSIS — W57XXXA Bitten or stung by nonvenomous insect and other nonvenomous arthropods, initial encounter: Secondary | ICD-10-CM

## 2015-01-03 DIAGNOSIS — B9689 Other specified bacterial agents as the cause of diseases classified elsewhere: Secondary | ICD-10-CM

## 2015-01-03 DIAGNOSIS — Z113 Encounter for screening for infections with a predominantly sexual mode of transmission: Secondary | ICD-10-CM | POA: Insufficient documentation

## 2015-01-03 DIAGNOSIS — T148 Other injury of unspecified body region: Secondary | ICD-10-CM

## 2015-01-03 DIAGNOSIS — N760 Acute vaginitis: Secondary | ICD-10-CM

## 2015-01-03 DIAGNOSIS — A499 Bacterial infection, unspecified: Secondary | ICD-10-CM

## 2015-01-03 LAB — POCT URINALYSIS DIP (DEVICE)
BILIRUBIN URINE: NEGATIVE
Glucose, UA: NEGATIVE mg/dL
Ketones, ur: NEGATIVE mg/dL
LEUKOCYTES UA: NEGATIVE
Nitrite: NEGATIVE
PROTEIN: NEGATIVE mg/dL
SPECIFIC GRAVITY, URINE: 1.025 (ref 1.005–1.030)
UROBILINOGEN UA: 4 mg/dL — AB (ref 0.0–1.0)
pH: 6.5 (ref 5.0–8.0)

## 2015-01-03 LAB — POCT PREGNANCY, URINE: PREG TEST UR: NEGATIVE

## 2015-01-03 MED ORDER — METRONIDAZOLE 500 MG PO TABS: 500.0000 mg | ORAL_TABLET | Freq: Two times a day (BID) | ORAL | Status: DC

## 2015-01-03 MED ORDER — TRIAMCINOLONE ACETONIDE 0.5 % EX OINT: 1.0000 "application " | TOPICAL_OINTMENT | Freq: Two times a day (BID) | CUTANEOUS | Status: DC

## 2015-01-03 MED ORDER — FLUCONAZOLE 150 MG PO TABS: 150.0000 mg | ORAL_TABLET | Freq: Once | ORAL | Status: DC

## 2015-01-03 NOTE — ED Notes (Signed)
C/o rash on bilateral arms since Monday Rash does itch Benadryl used as tx  C/o vaginal discharge States she has brown discharge since Thursday  12/19/14 did check up since she had abortion States she has vaginal odor

## 2015-01-03 NOTE — Discharge Instructions (Signed)
The rash is bug bites. Use triamcinolone ointment twice a day as needed for itching.  The discharge looks like bacterial vaginosis. Take Flagyl twice a day for 7 days. Take the Diflucan when you have finished the Flagyl. We will call you if any of your testing comes back positive.

## 2015-01-03 NOTE — ED Provider Notes (Signed)
CSN: [id number]     Arrival date & time 01/03/15  1832 History   First MD Initiated Contact with Patient 01/03/15 1852     Chief Complaint  Patient presents with  . Rash  . Vaginal Discharge   (Consider location/radiation/quality/duration/timing/severity/associated sxs/prior Treatment) HPI  She is a 29 year old woman here for evaluation of rash and vaginal discharge. She states the rashes on her arms. It started Monday night after being at a friend's house. It is quite itchy. She has tried taking Benadryl with some improvement. She reports a brown vaginal discharge for the last several weeks. It is intermittent. She is also had some spotting. She had a medical abortion on June 24 and a follow-up appointment on July 12. She was not concerned about the spotting, but developed an odor a few days ago. She has resumed sexual activity.  History reviewed. No pertinent past medical history. History reviewed. No pertinent past surgical history. History reviewed. No pertinent family history. History  Substance Use Topics  . Smoking status: Never Smoker   . Smokeless tobacco: Not on file  . Alcohol Use: Yes   OB History    Gravida Para Term Preterm AB TAB SAB Ectopic Multiple Living   1              Review of Systems As in history of present illness Allergies  Review of patient's allergies indicates no known allergies.  Home Medications   Prior to Admission medications   Medication Sig Start Date End Date Taking? Authorizing Provider  Chlorpheniramine-PSE-Ibuprofen (ADVIL ALLERGY SINUS) 2-30-200 MG TABS 1-2 tabs PO Q4-6 hrs PRN 10/28/13   Graylon Good, PA-C  cyclobenzaprine (FLEXERIL) 10 MG tablet Take 1 tablet (10 mg total) by mouth 2 (two) times daily as needed for muscle spasms. 12/12/14   Hope Orlene Och, NP  fluconazole (DIFLUCAN) 150 MG tablet Take 1 tablet (150 mg total) by mouth once. Repeat in 3 days if symptoms persist 01/03/15   Charm Rings, MD  fluticasone [facility]) 50 MCG/ACT  nasal spray Place 2 sprays into both nostrils 2 (two) times daily. Decrease to 2 sprays/nostril daily after 5 days 10/28/13   Graylon Good, PA-C  metroNIDAZOLE (FLAGYL) 500 MG tablet Take 1 tablet (500 mg total) by mouth 2 (two) times daily. 01/03/15   Charm Rings, MD  naproxen (NAPROSYN) 500 MG tablet Take 1 tablet (500 mg total) by mouth 2 (two) times daily. 12/12/14   Hope Orlene Och, NP  Olopatadine HCl 0.6 % SOLN 2 sprays/nostril BID 10/28/13   Graylon Good, PA-C  ondansetron (ZOFRAN) 4 MG tablet Take 1 tablet (4 mg total) by mouth every 6 (six) hours. 10/04/12   Jerelyn Scott, MD  predniSONE (DELTASONE) 10 MG tablet 4 tabs PO QD for 4 days; 3 tabs PO QD for 3 days; 2 tabs PO QD for 2 days; 1 tab PO QD for 1 day 10/28/13   Graylon Good, PA-C  traMADol (ULTRAM) 50 MG tablet Take 1 tablet (50 mg total) by mouth every 6 (six) hours as needed for pain. 09/05/12   Gilda Crease, MD  triamcinolone ointment (KENALOG) 0.5 % Apply 1 application topically 2 (two) times daily. 01/03/15   Charm Rings, MD   BP 133/91 mmHg  Pulse 74  Temp(Src) 98.6 F (37 C) (Oral)  Resp 18  SpO2 100%  LMP 12/06/2014 Physical Exam  Constitutional: She is oriented to person, place, and time. She appears well-developed and well-nourished. No distress.  Neck: Neck supple.  Cardiovascular: Normal rate.   Pulmonary/Chest: Effort normal.  Genitourinary: There is no rash on the right labia. There is no rash on the left labia. Cervix exhibits no discharge. No foreign body around the vagina. No signs of injury around the vagina. Vaginal discharge (Brown tinged) found.  Neurological: She is alert and oriented to person, place, and time.  Skin: Rash (erythematous wheals on bilateral upper arms.) noted.    ED Course  Procedures (including critical care time) Labs Review Labs Reviewed  POCT URINALYSIS DIP (DEVICE) - Abnormal; Notable for the following:    Hgb urine dipstick TRACE (*)    Urobilinogen, UA 4.0 (*)     All other components within normal limits  POCT PREGNANCY, URINE  CERVICOVAGINAL ANCILLARY ONLY    Imaging Review No results found.   MDM   1. Bug bites   2. BV (bacterial vaginosis)    Triamcinolone for the bug bites. Vaginal swab sent. Treat for BV with Flagyl. Prescription for Diflucan given to be used if she develops a yeast infection.    Charm Rings, MD 01/03/15 [phone]

## 2015-01-04 LAB — CERVICOVAGINAL ANCILLARY ONLY
CHLAMYDIA, DNA PROBE: NEGATIVE
NEISSERIA GONORRHEA: NEGATIVE

## 2015-01-04 NOTE — ED Notes (Signed)
GC negative, chlamydia negative, still waiting for wet prep report

## 2015-01-05 LAB — CERVICOVAGINAL ANCILLARY ONLY: WET PREP (BD AFFIRM): POSITIVE — AB

## 2015-01-05 NOTE — ED Notes (Signed)
Final report positive for BV. Treatment adequate w metronidazole . Called and gave patient report, and advised to complete Rx as written.

## 2015-06-10 HISTORY — PX: WISDOM TOOTH EXTRACTION: SHX21

## 2016-02-22 ENCOUNTER — Encounter: Payer: Self-pay | Admitting: *Deleted

## 2016-02-22 LAB — PROCEDURE REPORT - SCANNED: Pap: NEGATIVE

## 2016-02-24 ENCOUNTER — Encounter: Payer: Self-pay | Admitting: *Deleted

## 2016-02-24 DIAGNOSIS — Z3490 Encounter for supervision of normal pregnancy, unspecified, unspecified trimester: Secondary | ICD-10-CM | POA: Insufficient documentation

## 2016-02-26 ENCOUNTER — Encounter: Payer: Self-pay | Admitting: Obstetrics and Gynecology

## 2016-02-26 ENCOUNTER — Ambulatory Visit (INDEPENDENT_AMBULATORY_CARE_PROVIDER_SITE_OTHER): Payer: Medicaid Other | Admitting: Obstetrics and Gynecology

## 2016-02-26 ENCOUNTER — Encounter: Payer: Self-pay | Admitting: *Deleted

## 2016-02-26 VITALS — BP 118/76 | HR 85 | Temp 98.4°F | Wt 184.6 lb

## 2016-02-26 DIAGNOSIS — Z3201 Encounter for pregnancy test, result positive: Secondary | ICD-10-CM | POA: Diagnosis not present

## 2016-02-26 DIAGNOSIS — O219 Vomiting of pregnancy, unspecified: Secondary | ICD-10-CM | POA: Diagnosis not present

## 2016-02-26 DIAGNOSIS — Z3480 Encounter for supervision of other normal pregnancy, unspecified trimester: Secondary | ICD-10-CM

## 2016-02-26 DIAGNOSIS — Z3200 Encounter for pregnancy test, result unknown: Secondary | ICD-10-CM

## 2016-02-26 LAB — POCT URINE PREGNANCY: PREG TEST UR: POSITIVE — AB

## 2016-02-26 MED ORDER — PROMETHAZINE HCL 25 MG PO TABS: 25.0000 mg | ORAL_TABLET | Freq: Four times a day (QID) | ORAL | 1 refills | Status: DC | PRN

## 2016-02-26 NOTE — Patient Instructions (Signed)

## 2016-02-26 NOTE — Progress Notes (Signed)
Subjective:  Michelle Fox is a 30 y.o. [id number] at 4022w6d being seen today for initial  prenatal care.  She is currently monitored for the following issues for this low-risk pregnancy and has Supervision of normal pregnancy, antepartum on her problem list.  Patient reports nausea.  Contractions: Not present. Vag. Bleeding: None.   . Denies leaking of fluid.   The following portions of the patient's history were reviewed and updated as appropriate: allergies, current medications, past family history, past medical history, past social history, past surgical history and problem list. Problem list updated.  Objective:   Vitals:   02/26/16 1417  BP: 118/76  Pulse: 85  Temp: 98.4 F (36.9 C)  Weight: 184 lb 9.6 oz (83.7 kg)    Fetal Status:           General:  Alert, oriented and cooperative. Patient is in no acute distress.  Skin: Skin is warm and dry. No rash noted.   Cardiovascular: Normal heart rate noted  Respiratory: Normal respiratory effort, no problems with respiration noted  Abdomen: Soft, gravid, appropriate for gestational age. Pain/Pressure: Absent     Pelvic:  Cervical exam performed        Extremities: Normal range of motion.  Edema: None  Mental Status: Normal mood and affect. Normal behavior. Normal judgment and thought content.   Urinalysis: Urine Protein: Negative Urine Glucose: Negative  Assessment and Plan:  Pregnancy: [id number] at 5022w6d  1. Supervision of normal pregnancy, antepartum, unspecified trimester  - HIV antibody - Hemoglobinopathy evaluation - Varicella zoster antibody, IgG - Prenatal Profile I - Culture, OB Urine - ToxASSURE Select 13 (MW), Urine - PapIG, CtNg, rfxHPVall, 16/18  2. Nausea and vomiting during pregnancy ** - promethazine (PHENERGAN) 25 MG tablet; Take 1 tablet (25 mg total) by mouth every 6 (six) hours as needed for nausea or vomiting.  Dispense: 30 tablet; Refill: 1  Preterm labor symptoms and general obstetric precautions  including but not limited to vaginal bleeding, contractions, leaking of fluid and fetal movement were reviewed in detail with the patient. Please refer to After Visit Summary for other counseling recommendations.  Return in about 4 weeks (around 03/25/2016) for OB visit.   Hermina StaggersMichael L Laneisha Mino, MD

## 2016-02-26 NOTE — Progress Notes (Signed)
Patient stated that she has been really nauseous and vomiting throughout the day and would like something prescribed to help. Patient reports no pain/pressure.

## 2016-02-28 LAB — PAPIG, CTNG, RFXHPVALL, 16/18
Chlamydia, Nuc. Acid Amp: NEGATIVE
Gonococcus by Nucleic Acid Amp: NEGATIVE
PAP SMEAR COMMENT: 0

## 2016-03-01 LAB — URINE CULTURE, OB REFLEX

## 2016-03-01 LAB — CULTURE, OB URINE

## 2016-03-04 LAB — PRENATAL PROFILE I(LABCORP)
ANTIBODY SCREEN: NEGATIVE
BASOS: 0 %
Basophils Absolute: 0 10*3/uL (ref 0.0–0.2)
EOS (ABSOLUTE): 0.3 10*3/uL (ref 0.0–0.4)
Eos: 3 %
HEP B S AG: NEGATIVE
Hematocrit: 40.9 % (ref 34.0–46.6)
Hemoglobin: 12.9 g/dL (ref 11.1–15.9)
IMMATURE GRANS (ABS): 0 10*3/uL (ref 0.0–0.1)
Immature Granulocytes: 0 %
LYMPHS ABS: 2 10*3/uL (ref 0.7–3.1)
LYMPHS: 20 %
MCH: 27.3 pg (ref 26.6–33.0)
MCHC: 31.5 g/dL (ref 31.5–35.7)
MCV: 87 fL (ref 79–97)
MONOS ABS: 0.7 10*3/uL (ref 0.1–0.9)
Monocytes: 7 %
Neutrophils Absolute: 6.9 10*3/uL (ref 1.4–7.0)
Neutrophils: 70 %
Platelets: 320 10*3/uL (ref 150–379)
RBC: 4.73 x10E6/uL (ref 3.77–5.28)
RDW: 15.1 % (ref 12.3–15.4)
RH TYPE: POSITIVE
RPR: NONREACTIVE
Rubella Antibodies, IGG: 2.94 index (ref >0.99)
WBC: 10 10*3/uL (ref 3.4–10.8)

## 2016-03-05 ENCOUNTER — Telehealth: Payer: Self-pay | Admitting: *Deleted

## 2016-03-05 NOTE — Telephone Encounter (Signed)
[facility] results called to patient [facility] [address] [phone]

## 2016-03-06 LAB — HEMOGLOBINOPATHY EVALUATION
HGB C: 0 %
HGB S: 0 %
Hemoglobin A2 Quantitation: 2.6 % (ref 0.7–3.1)
Hemoglobin F Quantitation: 0 % (ref 0.0–2.0)
Hgb A: 97.4 % (ref 94.0–98.0)

## 2016-03-06 LAB — HIV ANTIBODY (ROUTINE TESTING W REFLEX): HIV Screen 4th Generation wRfx: NONREACTIVE

## 2016-03-06 LAB — TOXASSURE SELECT 13 (MW), URINE

## 2016-03-06 LAB — VARICELLA ZOSTER ANTIBODY, IGG: VARICELLA: 1763 {index} (ref >165)

## 2016-03-25 ENCOUNTER — Ambulatory Visit (INDEPENDENT_AMBULATORY_CARE_PROVIDER_SITE_OTHER): Payer: Self-pay | Admitting: Obstetrics and Gynecology

## 2016-03-25 VITALS — BP 120/80 | HR 89 | Wt 191.0 lb

## 2016-03-25 DIAGNOSIS — Z3480 Encounter for supervision of other normal pregnancy, unspecified trimester: Secondary | ICD-10-CM

## 2016-03-25 DIAGNOSIS — Z3482 Encounter for supervision of other normal pregnancy, second trimester: Secondary | ICD-10-CM

## 2016-03-25 NOTE — Progress Notes (Signed)
   PRENATAL VISIT NOTE  Subjective:  Michelle Fox is a 30 y.o. [id number] at 4540w6d being seen today for ongoing prenatal care.  She is currently monitored for the following issues for this low-risk pregnancy and has Supervision of normal pregnancy, antepartum on her problem list.  Patient reports no complaints.  Contractions: Not present. Vag. Bleeding: None.   . Denies leaking of fluid.   The following portions of the patient's history were reviewed and updated as appropriate: allergies, current medications, past family history, past medical history, past social history, past surgical history and problem list. Problem list updated.  Objective:   Vitals:   03/25/16 0957  BP: 120/80  Pulse: 89  Weight: 191 lb (86.6 kg)    Fetal Status:           General:  Alert, oriented and cooperative. Patient is in no acute distress.  Skin: Skin is warm and dry. No rash noted.   Cardiovascular: Normal heart rate noted  Respiratory: Normal respiratory effort, no problems with respiration noted  Abdomen: Soft, gravid, appropriate for gestational age. Pain/Pressure: Absent     Pelvic:  Cervical exam deferred        Extremities: Normal range of motion.  Edema: None  Mental Status: Normal mood and affect. Normal behavior. Normal judgment and thought content.   Assessment and Plan:  Pregnancy: [id number] at 3540w6d  1. Supervision of other normal pregnancy, antepartum Patient is doing well without complaints Anatomy ultrasound ordered Patient declined flu vaccine Patient declined quad screen - US OB Comp + 14 Wk; Future  General obstetric precautions including but not limited to vaginal bleeding, contractions, leaking of fluid and fetal movement were reviewed in detail with the patient. Please refer to After Visit Summary for other counseling recommendations.  Return in about 4 weeks (around 04/22/2016).  Catalina AntiguaPeggy Cait Locust, MD

## 2016-03-25 NOTE — Addendum Note (Signed)
Addended by: Anell BarrHOWARD, Jandi Swiger L on: 03/25/2016 10:23 AM   Modules accepted: Orders

## 2016-04-04 ENCOUNTER — Encounter (HOSPITAL_COMMUNITY): Payer: Self-pay | Admitting: Obstetrics and Gynecology

## 2016-04-15 ENCOUNTER — Ambulatory Visit (HOSPITAL_COMMUNITY)
Admission: RE | Admit: 2016-04-15 | Discharge: 2016-04-15 | Disposition: A | Discharge status: Home / Self Care | Payer: Medicaid Other | Source: Ambulatory Visit | Attending: Obstetrics and Gynecology | Admitting: Obstetrics and Gynecology

## 2016-04-15 ENCOUNTER — Other Ambulatory Visit: Payer: Self-pay | Admitting: Obstetrics and Gynecology

## 2016-04-15 DIAGNOSIS — Z363 Encounter for antenatal screening for malformations: Secondary | ICD-10-CM | POA: Diagnosis present

## 2016-04-15 DIAGNOSIS — Z3687 Encounter for antenatal screening for uncertain dates: Secondary | ICD-10-CM

## 2016-04-15 DIAGNOSIS — Z3480 Encounter for supervision of other normal pregnancy, unspecified trimester: Secondary | ICD-10-CM

## 2016-04-15 DIAGNOSIS — Z3A15 15 weeks gestation of pregnancy: Secondary | ICD-10-CM | POA: Diagnosis not present

## 2016-04-15 DIAGNOSIS — Z3482 Encounter for supervision of other normal pregnancy, second trimester: Secondary | ICD-10-CM | POA: Diagnosis not present

## 2016-04-22 ENCOUNTER — Ambulatory Visit (INDEPENDENT_AMBULATORY_CARE_PROVIDER_SITE_OTHER): Payer: Self-pay | Admitting: Obstetrics and Gynecology

## 2016-04-22 ENCOUNTER — Encounter: Payer: Self-pay | Admitting: *Deleted

## 2016-04-22 DIAGNOSIS — Z3482 Encounter for supervision of other normal pregnancy, second trimester: Secondary | ICD-10-CM

## 2016-04-22 DIAGNOSIS — O219 Vomiting of pregnancy, unspecified: Secondary | ICD-10-CM

## 2016-04-22 DIAGNOSIS — Z3480 Encounter for supervision of other normal pregnancy, unspecified trimester: Secondary | ICD-10-CM

## 2016-04-22 MED ORDER — VITAFOL GUMMIES 3.33-0.333-34.8 MG PO CHEW: 1.0000 | CHEWABLE_TABLET | Freq: Every day | ORAL | 3 refills | Status: DC

## 2016-04-22 NOTE — Progress Notes (Signed)
Patient is in the office reports feeling fetal movement.

## 2016-04-22 NOTE — Progress Notes (Signed)
Subjective:  Michelle Fox is a 30 y.o. [id number] at 541w4d being seen today for ongoing prenatal care.  She is currently monitored for the following issues for this low-risk pregnancy and has Supervision of normal pregnancy, antepartum and Nausea and vomiting during pregnancy on her problem list.  Patient reports nausea.  Contractions: Not present. Vag. Bleeding: None.  Movement: Present. Denies leaking of fluid.   The following portions of the patient's history were reviewed and updated as appropriate: allergies, current medications, past family history, past medical history, past social history, past surgical history and problem list. Problem list updated.  Objective:   Vitals:   04/22/16 0843  BP: 117/77  Pulse: 88  Temp: 98.1 F (36.7 C)  Weight: 196 lb (88.9 kg)    Fetal Status: Fetal Heart Rate (bpm): 151   Movement: Present     General:  Alert, oriented and cooperative. Patient is in no acute distress.  Skin: Skin is warm and dry. No rash noted.   Cardiovascular: Normal heart rate noted  Respiratory: Normal respiratory effort, no problems with respiration noted  Abdomen: Soft, gravid, appropriate for gestational age. Pain/Pressure: Absent     Pelvic:  Cervical exam deferred        Extremities: Normal range of motion.  Edema: None  Mental Status: Normal mood and affect. Normal behavior. Normal judgment and thought content.   Urinalysis:      Assessment and Plan:  Pregnancy: [id number] at 681w4d  1. Supervision of other normal pregnancy, antepartum Dates change from [place]. Declines genetic testing - US MFM OB FOLLOW UP; Future - Prenatal Vit-Fe Phos-FA-Omega (VITAFOL GUMMIES) 3.33-0.333-34.8 MG CHEW; Chew 1 Package by mouth daily.  Dispense: 90 tablet; Refill: 3  2. Nausea and vomiting during pregnancy Samples of Diclegis provided. Diet modification reviewed with pt.  Preterm labor symptoms and general obstetric precautions including but not limited to vaginal bleeding,  contractions, leaking of fluid and fetal movement were reviewed in detail with the patient. Please refer to After Visit Summary for other counseling recommendations.  No Follow-up on file.   Hermina StaggersMichael L Randol Zumstein, MD

## 2016-04-22 NOTE — Patient Instructions (Signed)

## 2016-05-06 ENCOUNTER — Telehealth: Payer: Self-pay | Admitting: *Deleted

## 2016-05-06 DIAGNOSIS — J302 Other seasonal allergic rhinitis: Secondary | ICD-10-CM

## 2016-05-06 MED ORDER — LORATADINE 10 MG PO TABS: 10.0000 mg | ORAL_TABLET | Freq: Every day | ORAL | 5 refills | Status: DC

## 2016-05-06 NOTE — Telephone Encounter (Signed)
Patient is calling requesting a Rx for her allergy medication. Told patient it is safe to use OTC Claritin or Zyrtec. Patient request Rx sent. ( OK per Dr Clearance CootsHarper)

## 2016-05-13 ENCOUNTER — Other Ambulatory Visit: Payer: Self-pay | Admitting: Obstetrics and Gynecology

## 2016-05-13 ENCOUNTER — Ambulatory Visit (HOSPITAL_COMMUNITY)
Admission: RE | Admit: 2016-05-13 | Discharge: 2016-05-13 | Disposition: A | Discharge status: Home / Self Care | Payer: Medicaid Other | Source: Ambulatory Visit | Attending: Obstetrics and Gynecology | Admitting: Obstetrics and Gynecology

## 2016-05-13 DIAGNOSIS — Z362 Encounter for other antenatal screening follow-up: Secondary | ICD-10-CM | POA: Diagnosis present

## 2016-05-13 DIAGNOSIS — Z3480 Encounter for supervision of other normal pregnancy, unspecified trimester: Secondary | ICD-10-CM

## 2016-05-13 DIAGNOSIS — Z363 Encounter for antenatal screening for malformations: Secondary | ICD-10-CM

## 2016-05-13 DIAGNOSIS — Z3A19 19 weeks gestation of pregnancy: Secondary | ICD-10-CM | POA: Diagnosis not present

## 2016-05-20 ENCOUNTER — Ambulatory Visit (INDEPENDENT_AMBULATORY_CARE_PROVIDER_SITE_OTHER): Payer: Medicaid Other | Admitting: Obstetrics and Gynecology

## 2016-05-20 VITALS — BP 123/77 | HR 102 | Wt 207.8 lb

## 2016-05-20 DIAGNOSIS — Z3482 Encounter for supervision of other normal pregnancy, second trimester: Secondary | ICD-10-CM

## 2016-05-20 DIAGNOSIS — Z3480 Encounter for supervision of other normal pregnancy, unspecified trimester: Secondary | ICD-10-CM

## 2016-05-20 NOTE — Progress Notes (Signed)
Patient is concerned about her weight gain. Patient would like to review her US results

## 2016-05-20 NOTE — Progress Notes (Signed)
Subjective:  Michelle Fox is a 30 y.o. [id number] at 8996w4d being seen today for ongoing prenatal care.  She is currently monitored for the following issues for this low-risk pregnancy and has Supervision of normal pregnancy, antepartum and Nausea and vomiting during pregnancy on her problem list.  Patient reports no complaints.  Contractions: Not present. Vag. Bleeding: None.  Movement: Present. Denies leaking of fluid.   The following portions of the patient's history were reviewed and updated as appropriate: allergies, current medications, past family history, past medical history, past social history, past surgical history and problem list. Problem list updated.  Objective:   Vitals:   05/20/16 0901  BP: 123/77  Pulse: (!) 102  Weight: 207 lb 12.8 oz (94.3 kg)    Fetal Status: Fetal Heart Rate (bpm): 150   Movement: Present     General:  Alert, oriented and cooperative. Patient is in no acute distress.  Skin: Skin is warm and dry. No rash noted.   Cardiovascular: Normal heart rate noted  Respiratory: Normal respiratory effort, no problems with respiration noted  Abdomen: Soft, gravid, appropriate for gestational age. Pain/Pressure: Absent     Pelvic:  Cervical exam deferred        Extremities: Normal range of motion.  Edema: None  Mental Status: Normal mood and affect. Normal behavior. Normal judgment and thought content.   Urinalysis:      Assessment and Plan:  Pregnancy: [id number] at 5696w4d  1. Supervision of other normal pregnancy, antepartum Doing well Desires BTL, will need to sign consent at later OB visit  Preterm labor symptoms and general obstetric precautions including but not limited to vaginal bleeding, contractions, leaking of fluid and fetal movement were reviewed in detail with the patient. Please refer to After Visit Summary for other counseling recommendations.  Return in about 4 weeks (around 06/17/2016) for OB visit.   Hermina StaggersMichael L Samari Bittinger, MD

## 2016-05-29 ENCOUNTER — Encounter (HOSPITAL_COMMUNITY): Payer: Self-pay

## 2016-05-29 ENCOUNTER — Inpatient Hospital Stay (HOSPITAL_COMMUNITY)
Admission: AD | Admit: 2016-05-29 | Discharge: 2016-05-29 | Disposition: A | Discharge status: Home / Self Care | Payer: Medicaid Other | Source: Ambulatory Visit | Attending: Obstetrics & Gynecology | Admitting: Obstetrics & Gynecology

## 2016-05-29 DIAGNOSIS — K219 Gastro-esophageal reflux disease without esophagitis: Secondary | ICD-10-CM | POA: Diagnosis not present

## 2016-05-29 DIAGNOSIS — O26892 Other specified pregnancy related conditions, second trimester: Secondary | ICD-10-CM | POA: Diagnosis not present

## 2016-05-29 DIAGNOSIS — Z3A21 21 weeks gestation of pregnancy: Secondary | ICD-10-CM | POA: Insufficient documentation

## 2016-05-29 DIAGNOSIS — R12 Heartburn: Secondary | ICD-10-CM | POA: Insufficient documentation

## 2016-05-29 DIAGNOSIS — O99612 Diseases of the digestive system complicating pregnancy, second trimester: Secondary | ICD-10-CM | POA: Diagnosis not present

## 2016-05-29 DIAGNOSIS — R1110 Vomiting, unspecified: Secondary | ICD-10-CM | POA: Diagnosis not present

## 2016-05-29 DIAGNOSIS — R1012 Left upper quadrant pain: Secondary | ICD-10-CM | POA: Diagnosis present

## 2016-05-29 DIAGNOSIS — Z3480 Encounter for supervision of other normal pregnancy, unspecified trimester: Secondary | ICD-10-CM

## 2016-05-29 LAB — COMPREHENSIVE METABOLIC PANEL
ALK PHOS: 48 U/L (ref 38–126)
ALT: 16 U/L (ref 14–54)
ANION GAP: 5 (ref 5–15)
AST: 15 U/L (ref 15–41)
Albumin: 2.9 g/dL — ABNORMAL LOW (ref 3.5–5.0)
BILIRUBIN TOTAL: 0.2 mg/dL — AB (ref 0.3–1.2)
BUN: 8 mg/dL (ref 6–20)
CALCIUM: 8.6 mg/dL — AB (ref 8.9–10.3)
CO2: 24 mmol/L (ref 22–32)
Chloride: 105 mmol/L (ref 101–111)
Creatinine, Ser: 0.58 mg/dL (ref 0.44–1.00)
GFR calc Af Amer: >60 mL/min (ref >60)
Glucose, Bld: 105 mg/dL — ABNORMAL HIGH (ref 65–99)
POTASSIUM: 3.8 mmol/L (ref 3.5–5.1)
Sodium: 134 mmol/L — ABNORMAL LOW (ref 135–145)
TOTAL PROTEIN: 6.6 g/dL (ref 6.5–8.1)

## 2016-05-29 LAB — URINALYSIS, ROUTINE W REFLEX MICROSCOPIC
Bilirubin Urine: NEGATIVE
GLUCOSE, UA: NEGATIVE mg/dL
HGB URINE DIPSTICK: NEGATIVE
Ketones, ur: NEGATIVE mg/dL
LEUKOCYTES UA: NEGATIVE
NITRITE: NEGATIVE
PH: 6 (ref 5.0–8.0)
Protein, ur: 30 mg/dL — AB
SPECIFIC GRAVITY, URINE: 1.026 (ref 1.005–1.030)

## 2016-05-29 LAB — CBC
HEMATOCRIT: 34.9 % — AB (ref 36.0–46.0)
HEMOGLOBIN: 11.8 g/dL — AB (ref 12.0–15.0)
MCH: 28.1 pg (ref 26.0–34.0)
MCHC: 33.8 g/dL (ref 30.0–36.0)
MCV: 83.1 fL (ref 78.0–100.0)
Platelets: 224 10*3/uL (ref 150–400)
RBC: 4.2 MIL/uL (ref 3.87–5.11)
RDW: 14.9 % (ref 11.5–15.5)
WBC: 10.4 10*3/uL (ref 4.0–10.5)

## 2016-05-29 LAB — LIPASE, BLOOD: LIPASE: 14 U/L (ref 11–51)

## 2016-05-29 MED ORDER — PANTOPRAZOLE SODIUM 20 MG PO TBEC: 20.0000 mg | DELAYED_RELEASE_TABLET | Freq: Every day | ORAL | 1 refills | Status: DC

## 2016-05-29 MED ORDER — GI COCKTAIL ~~LOC~~
30.0000 mL | Freq: Once | ORAL | Status: AC
  Administered 2016-05-29: 30 mL via ORAL
  Filled 2016-05-29: qty 30

## 2016-05-29 NOTE — Discharge Instructions (Signed)
Heartburn Introduction Heartburn is a type of pain or discomfort that can happen in the throat or chest. It is often described as a burning pain. It may also cause a bad taste in the mouth. Heartburn may feel worse when you lie down or bend over. It may be caused by stomach contents that move back up (reflux) into the tube that connects the mouth with the stomach (esophagus). Follow these instructions at home: Take these actions to lessen your discomfort and to help avoid problems. Diet  Follow a diet as told by your doctor. You may need to avoid foods and drinks such as:  Coffee and tea (with or without caffeine).  Drinks that contain alcohol.  Energy drinks and sports drinks.  Carbonated drinks or sodas.  Chocolate and cocoa.  Peppermint and mint flavorings.  Garlic and onions.  Horseradish.  Spicy and acidic foods, such as peppers, chili powder, curry powder, vinegar, hot sauces, and BBQ sauce.  Citrus fruit juices and citrus fruits, such as oranges, lemons, and limes.  Tomato-based foods, such as red sauce, chili, salsa, and pizza with red sauce.  Fried and fatty foods, such as donuts, french fries, potato chips, and high-fat dressings.  High-fat meats, such as hot dogs, rib eye steak, sausage, ham, and bacon.  High-fat dairy items, such as whole milk, butter, and cream cheese.  Eat small meals often. Avoid eating large meals.  Avoid drinking large amounts of liquid with your meals.  Avoid eating meals during the 2-3 hours before bedtime.  Avoid lying down right after you eat.  Do not exercise right after you eat. General instructions  Pay attention to any changes in your symptoms.  Take over-the-counter and prescription medicines only as told by your doctor. Do not take aspirin, ibuprofen, or other NSAIDs unless your doctor says it is okay.  Do not use any tobacco products, including cigarettes, chewing tobacco, and e-cigarettes. If you need help quitting, ask  your doctor.  Wear loose clothes. Do not wear anything tight around your waist.  Raise (elevate) the head of your bed about 6 inches (15 cm).  Try to lower your stress. If you need help doing this, ask your doctor.  If you are overweight, lose an amount of weight that is healthy for you. Ask your doctor about a safe weight loss goal.  Keep all follow-up visits as told by your doctor. This is important. Contact a doctor if:  You have new symptoms.  You lose weight and you do not know why it is happening.  You have trouble swallowing, or it hurts to swallow.  You have wheezing or a cough that keeps happening.  Your symptoms do not get better with treatment.  You have heartburn often for more than two weeks. Get help right away if:  You have pain in your arms, neck, jaw, teeth, or back.  You feel sweaty, dizzy, or light-headed.  You have chest pain or shortness of breath.  You throw up (vomit) and your throw up looks like blood or coffee grounds.  Your poop (stool) is bloody or black. This information is not intended to replace advice given to you by your health care provider. Make sure you discuss any questions you have with your health care provider. Document Released: 02/05/2011 Document Revised: 11/01/2015 Document Reviewed: 09/20/2014  2017 Elsevier Food Choices for Gastroesophageal Reflux Disease, Adult When you have gastroesophageal reflux disease (GERD), the foods you eat and your eating habits are very important. Choosing the right  foods can help ease your discomfort. What guidelines do I need to follow?  Choose fruits, vegetables, whole grains, and low-fat dairy products.  Choose low-fat meat, fish, and poultry.  Limit fats such as oils, salad dressings, butter, nuts, and avocado.  Keep a food diary. This helps you identify foods that cause symptoms.  Avoid foods that cause symptoms. These may be different for everyone.  Eat small meals often instead of 3  large meals a day.  Eat your meals slowly, in a place where you are relaxed.  Limit fried foods.  Cook foods using methods other than frying.  Avoid drinking alcohol.  Avoid drinking large amounts of liquids with your meals.  Avoid bending over or lying down until 2-3 hours after eating. What foods are not recommended? These are some foods and drinks that may make your symptoms worse: Vegetables  Tomatoes. Tomato juice. Tomato and spaghetti sauce. Chili peppers. Onion and garlic. Horseradish. Fruits  Oranges, grapefruit, and lemon (fruit and juice). Meats  High-fat meats, fish, and poultry. This includes hot dogs, ribs, ham, sausage, salami, and bacon. Dairy  Whole milk and chocolate milk. Sour cream. Cream. Butter. Ice cream. Cream cheese. Drinks  Coffee and tea. Bubbly (carbonated) drinks or energy drinks. Condiments  Hot sauce. Barbecue sauce. Sweets/Desserts  Chocolate and cocoa. Donuts. Peppermint and spearmint. Fats and Oils  High-fat foods. This includes [place] fries and potato chips. Other  Vinegar. Strong spices. This includes black pepper, white pepper, red pepper, cayenne, curry powder, cloves, ginger, and chili powder. The items listed above may not be a complete list of foods and drinks to avoid. Contact your dietitian for more information.  This information is not intended to replace advice given to you by your health care provider. Make sure you discuss any questions you have with your health care provider. Document Released: 11/25/2011 Document Revised: 11/01/2015 Document Reviewed: 03/30/2013 Elsevier Interactive Patient Education  2017 [employer]

## 2016-05-29 NOTE — MAU Provider Note (Signed)
History     CSN: [id number]  Arrival date and time: 05/29/16 1715   First Provider Initiated Contact with Patient 05/29/16 1806      Chief Complaint  Patient presents with  . Abdominal Pain   [id number] @21 .6 weeks here with LUQ abdominal pain x2 weeks. She describes as achy and constant. Pain is worsened by meals and walking. She reports vomiting twice today and 3 times yesterday. She denies VB, LOF, and ctx. She reports +FM.     OB History    Gravida Para Term Preterm AB Living   5 2 2   2 2    SAB TAB Ectopic Multiple Live Births     2     2      History reviewed. No pertinent past medical history.  History reviewed. No pertinent surgical history.  Family History  Problem Relation Age of Onset  . Diabetes Maternal Grandmother   . Diabetes Maternal Grandfather   . Diabetes Paternal Grandmother   . Diabetes Paternal Grandfather     Social History  Substance Use Topics  . Smoking status: Never Smoker  . Smokeless tobacco: Never Used  . Alcohol use Yes    Allergies: No Known Allergies  Prescriptions Prior to Admission  Medication Sig Dispense Refill Last Dose  . loratadine (CLARITIN) 10 MG tablet Take 10 mg by mouth daily as needed for allergies.   Past Week at Unknown time  . Prenatal Vit-Fe Phos-FA-Omega (VITAFOL GUMMIES) 3.33-0.333-34.8 MG CHEW Chew 3 each by mouth daily.   05/29/2016 at Unknown time  . promethazine (PHENERGAN) 25 MG tablet Take 1 tablet (25 mg total) by mouth every 6 (six) hours as needed for nausea or vomiting. 30 tablet 1 Past Month at Unknown time    Review of Systems  Constitutional: Negative.   Gastrointestinal: Positive for abdominal pain, heartburn and vomiting. Negative for constipation and diarrhea.   Physical Exam   Blood pressure 123/69, pulse 93, temperature 98.5 F (36.9 C), resp. rate 18, height 5' 6.5" (1.689 m), weight 93.9 kg (207 lb 1.9 oz), last menstrual period 12/05/2015.  Physical Exam  Constitutional: She is  oriented to person, place, and time. She appears well-developed and well-nourished. No distress.  HENT:  Head: Normocephalic and atraumatic.  Neck: Normal range of motion.  Cardiovascular: Normal rate.   Respiratory: Effort normal.  GI: Soft. She exhibits no distension. There is tenderness (mild, LUQ/epigastric). There is no rebound and no guarding.  Musculoskeletal: Normal range of motion.  Neurological: She is alert and oriented to person, place, and time.  Skin: Skin is warm and dry.  Psychiatric: She has a normal mood and affect.   FHT: 154 bpm  Results for orders placed or performed during the hospital encounter of 05/29/16 (from the past 24 hour(s))  Urinalysis, Routine w reflex microscopic     Status: Abnormal   Collection Time: 05/29/16  5:52 PM  Result Value Ref Range   Color, Urine YELLOW YELLOW   APPearance HAZY (A) CLEAR   Specific Gravity, Urine 1.026 1.005 - 1.030   pH 6.0 5.0 - 8.0   Glucose, UA NEGATIVE NEGATIVE mg/dL   Hgb urine dipstick NEGATIVE NEGATIVE   Bilirubin Urine NEGATIVE NEGATIVE   Ketones, ur NEGATIVE NEGATIVE mg/dL   Protein, ur 30 (A) NEGATIVE mg/dL   Nitrite NEGATIVE NEGATIVE   Leukocytes, UA NEGATIVE NEGATIVE   RBC / HPF 0-5 0 - 5 RBC/hpf   WBC, UA 0-5 0 - 5 WBC/hpf   Bacteria, UA  MANY (A) NONE SEEN   Squamous Epithelial / LPF 6-30 (A) NONE SEEN   Mucous PRESENT    Ca Oxalate Crys, UA PRESENT   CBC     Status: Abnormal   Collection Time: 05/29/16  6:24 PM  Result Value Ref Range   WBC 10.4 4.0 - 10.5 K/uL   RBC 4.20 3.87 - 5.11 MIL/uL   Hemoglobin 11.8 (L) 12.0 - 15.0 g/dL   HCT 16.134.9 (L) 09.636.0 - 04.546.0 %   MCV 83.1 78.0 - 100.0 fL   MCH 28.1 26.0 - 34.0 pg   MCHC 33.8 30.0 - 36.0 g/dL   RDW 40.914.9 81.111.5 - 91.415.5 %   Platelets 224 150 - 400 K/uL  Comprehensive metabolic panel     Status: Abnormal   Collection Time: 05/29/16  6:24 PM  Result Value Ref Range   Sodium 134 (L) 135 - 145 mmol/L   Potassium 3.8 3.5 - 5.1 mmol/L   Chloride 105 101  - 111 mmol/L   CO2 24 22 - 32 mmol/L   Glucose, Bld 105 (H) 65 - 99 mg/dL   BUN 8 6 - 20 mg/dL   Creatinine, Ser 7.820.58 0.44 - 1.00 mg/dL   Calcium 8.6 (L) 8.9 - 10.3 mg/dL   Total Protein 6.6 6.5 - 8.1 g/dL   Albumin 2.9 (L) 3.5 - 5.0 g/dL   AST 15 15 - 41 U/L   ALT 16 14 - 54 U/L   Alkaline Phosphatase 48 38 - 126 U/L   Total Bilirubin 0.2 (L) 0.3 - 1.2 mg/dL   GFR calc non Af Amer >60 >60 mL/min   GFR calc Af Amer >60 >60 mL/min   Anion gap 5 5 - 15  Lipase, blood     Status: None   Collection Time: 05/29/16  6:24 PM  Result Value Ref Range   Lipase 14 11 - 51 U/L   MAU Course  Procedures  MDM Labs ordered and reviewed. Pain improved after GI cocktail. No evidence of acute abdominal process. Stable for discharge home.  Assessment and Plan   1. Gastroesophageal reflux disease without esophagitis   2. Supervision of other normal pregnancy, antepartum   3. [redacted] weeks gestation of pregnancy    [facility]  Allergies as of 05/29/2016   No Known Allergies     Medication List    TAKE these medications   loratadine 10 MG tablet Commonly known as:  CLARITIN Take 10 mg by mouth daily as needed for allergies.   pantoprazole 20 MG tablet Commonly known as:  PROTONIX Take 1 tablet (20 mg total) by mouth daily.   promethazine 25 MG tablet Commonly known as:  PHENERGAN Take 1 tablet (25 mg total) by mouth every 6 (six) hours as needed for nausea or vomiting.   VITAFOL GUMMIES 3.33-0.333-34.8 MG Chew Chew 3 each by mouth daily.      Donette LarryMelanie Sherisse Fullilove, CNM 05/29/2016, 6:07 PM

## 2016-05-29 NOTE — MAU Note (Signed)
Pt reports she has a ache pain under her left breast/rib that has been going on for about 2 weeks. Pain is constant and gets a little worse after eating

## 2016-06-17 ENCOUNTER — Ambulatory Visit (INDEPENDENT_AMBULATORY_CARE_PROVIDER_SITE_OTHER): Payer: Medicaid Other | Admitting: Obstetrics and Gynecology

## 2016-06-17 VITALS — BP 127/80 | HR 111 | Wt 209.0 lb

## 2016-06-17 DIAGNOSIS — O219 Vomiting of pregnancy, unspecified: Secondary | ICD-10-CM

## 2016-06-17 DIAGNOSIS — Z3009 Encounter for other general counseling and advice on contraception: Secondary | ICD-10-CM

## 2016-06-17 DIAGNOSIS — Z3482 Encounter for supervision of other normal pregnancy, second trimester: Secondary | ICD-10-CM

## 2016-06-17 DIAGNOSIS — Z3480 Encounter for supervision of other normal pregnancy, unspecified trimester: Secondary | ICD-10-CM

## 2016-06-17 NOTE — Progress Notes (Signed)
Patient is in the office, reports good fetal movement. 

## 2016-06-17 NOTE — Progress Notes (Signed)
Subjective:  Michelle Fox is a 31 y.o. [name] at 7575w4d being seen today for ongoing prenatal care.  She is currently monitored for the following issues for this low-risk pregnancy and has Supervision of normal pregnancy, antepartum; Nausea and vomiting during pregnancy; and Unwanted fertility on her problem list.  Patient reports nasal congestion.  Contractions: Not present. Vag. Bleeding: None.  Movement: Present. Denies leaking of fluid.   The following portions of the patient's history were reviewed and updated as appropriate: allergies, current medications, past family history, past medical history, past social history, past surgical history and problem list. Problem list updated.  Objective:   Vitals:   06/17/16 0848  BP: 127/80  Pulse: (!) 111  Weight: 209 lb (94.8 kg)    Fetal Status: Fetal Heart Rate (bpm): 159   Movement: Present     General:  Alert, oriented and cooperative. Patient is in no acute distress.  Skin: Skin is warm and dry. No rash noted.   Cardiovascular: Normal heart rate noted  Respiratory: Normal respiratory effort, no problems with respiration noted  Abdomen: Soft, gravid, appropriate for gestational age. Pain/Pressure: Absent     Pelvic:  Cervical exam deferred        Extremities: Normal range of motion.  Edema: Trace  Mental Status: Normal mood and affect. Normal behavior. Normal judgment and thought content.   Urinalysis:      Assessment and Plan:  Pregnancy: [id number] at 1975w4d  1. Supervision of other normal pregnancy, antepartum Nasal saline spray or Flonase nasal spray for congestion Glucola next visit  2. Unwanted fertility BTL papers signed today  3. Nausea and vomiting during pregnancy Improved  Preterm labor symptoms and general obstetric precautions including but not limited to vaginal bleeding, contractions, leaking of fluid and fetal movement were reviewed in detail with the patient. Please refer to After Visit Summary for other  counseling recommendations.  Return in about 4 weeks (around 07/15/2016) for OB visit.   Michelle StaggersMichael L Emma-Lee Oddo, MD

## 2016-07-18 ENCOUNTER — Ambulatory Visit (INDEPENDENT_AMBULATORY_CARE_PROVIDER_SITE_OTHER): Payer: Medicaid Other | Admitting: Advanced Practice Midwife

## 2016-07-18 VITALS — BP 125/77 | HR 99 | Wt 209.0 lb

## 2016-07-18 DIAGNOSIS — Z3480 Encounter for supervision of other normal pregnancy, unspecified trimester: Secondary | ICD-10-CM

## 2016-07-18 DIAGNOSIS — Z3483 Encounter for supervision of other normal pregnancy, third trimester: Secondary | ICD-10-CM

## 2016-07-18 DIAGNOSIS — Z3A29 29 weeks gestation of pregnancy: Secondary | ICD-10-CM

## 2016-07-18 LAB — CBC
HCT: 34.8 % — ABNORMAL LOW (ref 35.0–45.0)
Hemoglobin: 11.5 g/dL — ABNORMAL LOW (ref 11.7–15.5)
MCH: 28 pg (ref 27.0–33.0)
MCHC: 33 g/dL (ref 32.0–36.0)
MCV: 84.9 fL (ref 80.0–100.0)
MPV: 9.8 fL (ref 7.5–12.5)
PLATELETS: 229 10*3/uL (ref 140–400)
RBC: 4.1 MIL/uL (ref 3.80–5.10)
RDW: 15.4 % — ABNORMAL HIGH (ref 11.0–15.0)
WBC: 8.4 10*3/uL (ref 3.8–10.8)

## 2016-07-18 MED ORDER — RANITIDINE HCL 150 MG PO TABS: 150.0000 mg | ORAL_TABLET | Freq: Two times a day (BID) | ORAL | 3 refills | Status: DC | PRN

## 2016-07-18 MED ORDER — VITAFOL GUMMIES 3.33-0.333-34.8 MG PO CHEW: 3.0000 | CHEWABLE_TABLET | Freq: Every day | ORAL | 3 refills | Status: DC

## 2016-07-18 NOTE — Progress Notes (Signed)
   PRENATAL VISIT NOTE  Subjective:  Michelle Fox is a 31 y.o. [id number] at 6842w0d being seen today for ongoing prenatal care.  She is currently monitored for the following issues for this low-risk pregnancy and has Supervision of normal pregnancy, antepartum; Nausea and vomiting during pregnancy; and Unwanted fertility on her problem list.  Patient reports heartburn not relived by Protonix (and it makes her feel "bad") and backache.  Contractions: Not present. Vag. Bleeding: None.  Movement: Present. Denies leaking of fluid.   The following portions of the patient's history were reviewed and updated as appropriate: allergies, current medications, past family history, past medical history, past social history, past surgical history and problem list. Problem list updated.  Objective:   Vitals:   07/18/16 0842  BP: 125/77  Pulse: 99  Weight: 209 lb (94.8 kg)    Fetal Status: Fetal Heart Rate (bpm): 152 Fundal Height: 30 cm Movement: Present     General:  Alert, oriented and cooperative. Patient is in no acute distress.  Skin: Skin is warm and dry. No rash noted.   Cardiovascular: Normal heart rate noted  Respiratory: Normal respiratory effort, no problems with respiration noted  Abdomen: Soft, gravid, appropriate for gestational age. Pain/Pressure: Absent     Pelvic:  Cervical exam deferred        Extremities: Normal range of motion.  Edema: Trace  Mental Status: Normal mood and affect. Normal behavior. Normal judgment and thought content.   Assessment and Plan:  Pregnancy: [id number] at 8142w0d  1. [redacted] weeks gestation of pregnancy  - Glucose Tolerance, 2 Hours w/1 Hour - CBC - RPR - HIV antibody (with reflex)  2. Heartburn pregnancy  - ranitidine (ZANTAC) 150 MG tablet; Take 1 tablet (150 mg total) by mouth 2 (two) times daily as needed for heartburn.  Dispense: 30 tablet; Refill: 3  3. Supervision of other normal pregnancy, antepartum  - Prenatal Vit-Fe Phos-FA-Omega (VITAFOL  GUMMIES) 3.33-0.333-34.8 MG CHEW; Chew 3 each by mouth daily.  Dispense: 90 tablet; Refill: 3  Preterm labor symptoms and general obstetric precautions including but not limited to vaginal bleeding, contractions, leaking of fluid and fetal movement were reviewed in detail with the patient. Please refer to After Visit Summary for other counseling recommendations.  Maternity support belt and massage Return in about 2 weeks (around 08/01/2016) for ROB.   Dorathy KinsmanVirginia Taylan Mayhan, CNM

## 2016-07-18 NOTE — Progress Notes (Signed)
Pt declines tdap 

## 2016-07-18 NOTE — Patient Instructions (Addendum)
Third Trimester of Pregnancy The third trimester is from week 29 through week 40 (months 7 through 9). The third trimester is a time when the unborn baby (fetus) is growing rapidly. At the end of the ninth month, the fetus is about 20 inches in length and weighs 6-10 pounds. Body changes during your third trimester Your body goes through many changes during pregnancy. The changes vary from woman to woman. During the third trimester:  Your weight will continue to increase. You can expect to gain 25-35 pounds (11-16 kg) by the end of the pregnancy.  You may begin to get stretch marks on your hips, abdomen, and breasts.  You may urinate more often because the fetus is moving lower into your pelvis and pressing on your bladder.  You may develop or continue to have heartburn. This is caused by increased hormones that slow down muscles in the digestive tract.  You may develop or continue to have constipation because increased hormones slow digestion and cause the muscles that push waste through your intestines to relax.  You may develop hemorrhoids. These are swollen veins (varicose veins) in the rectum that can itch or be painful.  You may develop swollen, bulging veins (varicose veins) in your legs.  You may have increased body aches in the pelvis, back, or thighs. This is due to weight gain and increased hormones that are relaxing your joints.  You may have changes in your hair. These can include thickening of your hair, rapid growth, and changes in texture. Some women also have hair loss during or after pregnancy, or hair that feels dry or thin. Your hair will most likely return to normal after your baby is born.  Your breasts will continue to grow and they will continue to become tender. A yellow fluid (colostrum) may leak from your breasts. This is the first milk you are producing for your baby.  Your belly button may stick out.  You may notice more swelling in your hands, face, or  ankles.  You may have increased tingling or numbness in your hands, arms, and legs. The skin on your belly may also feel numb.  You may feel short of breath because of your expanding uterus.  You may have more problems sleeping. This can be caused by the size of your belly, increased need to urinate, and an increase in your body's metabolism.  You may notice the fetus "dropping," or moving lower in your abdomen.  You may have increased vaginal discharge.  Your cervix becomes thin and soft (effaced) near your due date. What to expect at prenatal visits You will have prenatal exams every 2 weeks until week 36. Then you will have weekly prenatal exams. During a routine prenatal visit:  You will be weighed to make sure you and the fetus are growing normally.  Your blood pressure will be taken.  Your abdomen will be measured to track your baby's growth.  The fetal heartbeat will be listened to.  Any test results from the previous visit will be discussed.  You may have a cervical check near your due date to see if you have effaced. At around 36 weeks, your health care provider will check your cervix. At the same time, your health care provider will also perform a test on the secretions of the vaginal tissue. This test is to determine if a type of bacteria, Group B streptococcus, is present. Your health care provider will explain this further. Your health care provider may ask you:    What your birth plan is.  How you are feeling.  If you are feeling the baby move.  If you have had any abnormal symptoms, such as leaking fluid, bleeding, severe headaches, or abdominal cramping.  If you are using any tobacco products, including cigarettes, chewing tobacco, and electronic cigarettes.  If you have any questions. Other tests or screenings that may be performed during your third trimester include:  Blood tests that check for low iron levels (anemia).  Fetal testing to check the health,  activity level, and growth of the fetus. Testing is done if you have certain medical conditions or if there are problems during the pregnancy.  Nonstress test (NST). This test checks the health of your baby to make sure there are no signs of problems, such as the baby not getting enough oxygen. During this test, a belt is placed around your belly. The baby is made to move, and its heart rate is monitored during movement. What is false labor? False labor is a condition in which you feel small, irregular tightenings of the muscles in the womb (contractions) that eventually go away. These are called Braxton Hicks contractions. Contractions may last for hours, days, or even weeks before true labor sets in. If contractions come at regular intervals, become more frequent, increase in intensity, or become painful, you should see your health care provider. What are the signs of labor?  Abdominal cramps.  Regular contractions that start at 10 minutes apart and become stronger and more frequent with time.  Contractions that start on the top of the uterus and spread down to the lower abdomen and back.  Increased pelvic pressure and dull back pain.  A watery or bloody mucus discharge that comes from the vagina.  Leaking of amniotic fluid. This is also known as your "water breaking." It could be a slow trickle or a gush. Let your doctor know if it has a color or strange odor. If you have any of these signs, call your health care provider right away, even if it is before your due date. Follow these instructions at home: Eating and drinking  Continue to eat regular, healthy meals.  Do not eat:  Raw meat or meat spreads.  Unpasteurized milk or cheese.  Unpasteurized juice.  Store-made salad.  Refrigerated smoked seafood.  Hot dogs or deli meat, unless they are piping hot.  More than 6 ounces of albacore tuna a week.  Shark, swordfish, king mackerel, or tile fish.  Store-made salads.  Raw  sprouts, such as mung bean or alfalfa sprouts.  Take prenatal vitamins as told by your health care provider.  Take 1000 mg of calcium daily as told by your health care provider.  If you develop constipation:  Take over-the-counter or prescription medicines.  Drink enough fluid to keep your urine clear or pale yellow.  Eat foods that are high in fiber, such as fresh fruits and vegetables, whole grains, and beans.  Limit foods that are high in fat and processed sugars, such as fried and sweet foods. Activity  Exercise only as directed by your health care provider. Healthy pregnant women should aim for 2 hours and 30 minutes of moderate exercise per week. If you experience any pain or discomfort while exercising, stop.  Avoid heavy lifting.  Do not exercise in extreme heat or humidity, or at high altitudes.  Wear low-heel, comfortable shoes.  Practice good posture.  Do not travel far distances unless it is absolutely necessary and only with the approval   of your health care provider.  Wear your seat belt at all times while in a car, on a bus, or on a plane.  Take frequent breaks and rest with your legs elevated if you have leg cramps or low back pain.  Do not use hot tubs, steam rooms, or saunas.  You may continue to have sex unless your health care provider tells you otherwise. Lifestyle  Do not use any products that contain nicotine or tobacco, such as cigarettes and e-cigarettes. If you need help quitting, ask your health care provider.  Do not drink alcohol.  Do not use any medicinal herbs or unprescribed drugs. These chemicals affect the formation and growth of the baby.  If you develop varicose veins:  Wear support pantyhose or compression stockings as told by your healthcare provider.  Elevate your feet for 15 minutes, 3-4 times a day.  Wear a supportive maternity bra to help with breast tenderness. General instructions  Take over-the-counter and prescription  medicines only as told by your health care provider. There are medicines that are either safe or unsafe to take during pregnancy.  Take warm sitz baths to soothe any pain or discomfort caused by hemorrhoids. Use hemorrhoid cream or witch hazel if your health care provider approves.  Avoid cat litter boxes and soil used by cats. These carry germs that can cause birth defects in the baby. If you have a cat, ask someone to clean the litter box for you.  To prepare for the arrival of your baby:  Take prenatal classes to understand, practice, and ask questions about the labor and delivery.  Make a trial run to the hospital.  Visit the hospital and tour the maternity area.  Arrange for maternity or paternity leave through employers.  Arrange for family and friends to take care of pets while you are in the hospital.  Purchase a rear-facing car seat and make sure you know how to install it in your car.  Pack your hospital bag.  Prepare the baby's nursery. Make sure to remove all pillows and stuffed animals from the baby's crib to prevent suffocation.  Visit your dentist if you have not gone during your pregnancy. Use a soft toothbrush to brush your teeth and be gentle when you floss.  Keep all prenatal follow-up visits as told by your health care provider. This is important. Contact a health care provider if:  You are unsure if you are in labor or if your water has broken.  You become dizzy.  You have mild pelvic cramps, pelvic pressure, or nagging pain in your abdominal area.  You have lower back pain.  You have persistent nausea, vomiting, or diarrhea.  You have an unusual or bad smelling vaginal discharge.  You have pain when you urinate. Get help right away if:  You have a fever.  You are leaking fluid from your vagina.  You have spotting or bleeding from your vagina.  You have severe abdominal pain or cramping.  You have rapid weight loss or weight gain.  You have  shortness of breath with chest pain.  You notice sudden or extreme swelling of your face, hands, ankles, feet, or legs.  Your baby makes fewer than 10 movements in 2 hours.  You have severe headaches that do not go away with medicine.  You have vision changes. Summary  The third trimester is from week 29 through week 40, months 7 through 9. The third trimester is a time when the unborn baby (fetus)   is growing rapidly.  During the third trimester, your discomfort may increase as you and your baby continue to gain weight. You may have abdominal, leg, and back pain, sleeping problems, and an increased need to urinate.  During the third trimester your breasts will keep growing and they will continue to become tender. A yellow fluid (colostrum) may leak from your breasts. This is the first milk you are producing for your baby.  False labor is a condition in which you feel small, irregular tightenings of the muscles in the womb (contractions) that eventually go away. These are called Braxton Hicks contractions. Contractions may last for hours, days, or even weeks before true labor sets in.  Signs of labor can include: abdominal cramps; regular contractions that start at 10 minutes apart and become stronger and more frequent with time; watery or bloody mucus discharge that comes from the vagina; increased pelvic pressure and dull back pain; and leaking of amniotic fluid. This information is not intended to replace advice given to you by your health care provider. Make sure you discuss any questions you have with your health care provider. Document Released: 05/20/2001 Document Revised: 11/01/2015 Document Reviewed: 07/27/2012 Elsevier Interactive Patient Education  2017 [employer].   Tdap Vaccine (Tetanus, Diphtheria and Pertussis): What You Need to Know 1. Why get vaccinated? Tetanus, diphtheria and pertussis are very serious diseases. Tdap vaccine can protect [place] from these diseases. And,  Tdap vaccine given to pregnant women can protect newborn babies against pertussis. TETANUS (Lockjaw) is rare in the [place] today. It causes painful muscle tightening and stiffness, usually all over the body.  It can lead to tightening of muscles in the head and neck so you can't open your mouth, swallow, or sometimes even breathe. Tetanus kills about 1 out of 10 people who are infected even after receiving the best medical care. DIPHTHERIA is also rare in the [place] today. It can cause a thick coating to form in the back of the throat.  It can lead to breathing problems, heart failure, paralysis, and death. PERTUSSIS (Whooping Cough) causes severe coughing spells, which can cause difficulty breathing, vomiting and disturbed sleep.  It can also lead to weight loss, incontinence, and rib fractures. Up to 2 in 100 adolescents and 5 in 100 adults with pertussis are hospitalized or have complications, which could include pneumonia or death. These diseases are caused by bacteria. Diphtheria and pertussis are spread from person to person through secretions from coughing or sneezing. Tetanus enters the body through cuts, scratches, or wounds. Before vaccines, as many as 200,000 cases of diphtheria, 200,000 cases of pertussis, and hundreds of cases of tetanus, were reported in the [place] each year. Since vaccination began, reports of cases for tetanus and diphtheria have dropped by about 99% and for pertussis by about 80%. 2. Tdap vaccine Tdap vaccine can protect adolescents and adults from tetanus, diphtheria, and pertussis. One dose of Tdap is routinely given at age 62 or 77. People who did not get Tdap at that age should get it as soon as possible. Tdap is especially important for healthcare professionals and anyone having close contact with a baby younger than 12 months. Pregnant women should get a dose of Tdap during every pregnancy, to protect the newborn from pertussis. Infants are  most at risk for severe, life-threatening complications from pertussis. Another vaccine, called Td, protects against tetanus and diphtheria, but not pertussis. A Td booster should be given every 10 years. Tdap may be  given as one of these boosters if you have never gotten Tdap before. Tdap may also be given after a severe cut or burn to prevent tetanus infection. Your doctor or the person giving you the vaccine can give you more information. Tdap may safely be given at the same time as other vaccines. 3. Some people should not get this vaccine  A person who has ever had a life-threatening allergic reaction after a previous dose of any diphtheria, tetanus or pertussis containing vaccine, OR has a severe allergy to any part of this vaccine, should not get Tdap vaccine. Tell the person giving the vaccine about any severe allergies.  Anyone who had coma or long repeated seizures within 7 days after a childhood dose of DTP or DTaP, or a previous dose of Tdap, should not get Tdap, unless a cause other than the vaccine was found. They can still get Td.  Talk to your doctor if you:  have seizures or another nervous system problem,  had severe pain or swelling after any vaccine containing diphtheria, tetanus or pertussis,  ever had a condition called Guillain-Barr Syndrome (GBS),  aren't feeling well on the day the shot is scheduled. 4. Risks With any medicine, including vaccines, there is a chance of side effects. These are usually mild and go away on their own. Serious reactions are also possible but are rare. Most people who get Tdap vaccine do not have any problems with it. Mild problems following Tdap: (Did not interfere with activities)  Pain where the shot was given (about 3 in 4 adolescents or 2 in 3 adults)  Redness or swelling where the shot was given (about 1 person in 5)  Mild fever of at least 100.36F (up to about 1 in 25 adolescents or 1 in 100 adults)  Headache (about 3 or 4  people in 10)  Tiredness (about 1 person in 3 or 4)  Nausea, vomiting, diarrhea, stomach ache (up to 1 in 4 adolescents or 1 in 10 adults)  Chills, sore joints (about 1 person in 10)  Body aches (about 1 person in 3 or 4)  Rash, swollen glands (uncommon) Moderate problems following Tdap: (Interfered with activities, but did not require medical attention)  Pain where the shot was given (up to 1 in 5 or 6)  Redness or swelling where the shot was given (up to about 1 in 16 adolescents or 1 in 12 adults)  Fever over 102F (about 1 in 100 adolescents or 1 in 250 adults)  Headache (about 1 in 7 adolescents or 1 in 10 adults)  Nausea, vomiting, diarrhea, stomach ache (up to 1 or 3 people in 100)  Swelling of the entire arm where the shot was given (up to about 1 in 500). Severe problems following Tdap: (Unable to perform usual activities; required medical attention)  Swelling, severe pain, bleeding and redness in the arm where the shot was given (rare). Problems that could happen after any vaccine:  People sometimes faint after a medical procedure, including vaccination. Sitting or lying down for about 15 minutes can help prevent fainting, and injuries caused by a fall. Tell your doctor if you feel dizzy, or have vision changes or ringing in the ears.  Some people get severe pain in the shoulder and have difficulty moving the arm where a shot was given. This happens very rarely.  Any medication can cause a severe allergic reaction. Such reactions from a vaccine are very rare, estimated at fewer than 1 in a  million doses, and would happen within a few minutes to a few hours after the vaccination. As with any medicine, there is a very remote chance of a vaccine causing a serious injury or death. The safety of vaccines is always being monitored. For more information, visit: http://floyd.org/ 5. What if there is a serious problem? What should I look for? Look for anything that  concerns you, such as signs of a severe allergic reaction, very high fever, or unusual behavior. Signs of a severe allergic reaction can include hives, swelling of the face and throat, difficulty breathing, a fast heartbeat, dizziness, and weakness. These would usually start a few minutes to a few hours after the vaccination. What should I do?  If you think it is a severe allergic reaction or other emergency that can't wait, call 9-1-1 or get the person to the nearest hospital. Otherwise, call your doctor.  Afterward, the reaction should be reported to the Vaccine Adverse Event Reporting System (VAERS). Your doctor might file this report, or you can do it yourself through the VAERS web site at www.vaers.LAgents.no, or by calling 1-802-603-4217.  VAERS does not give medical advice. 6. The National Vaccine Injury Compensation Program The [employer] Vaccine Injury Compensation Program (VICP) is a federal program that was created to compensate people who may have been injured by certain vaccines. Persons who believe they may have been injured by a vaccine can learn about the program and about filing a claim by calling 1-404-428-1282 or visiting the VICP website at SpiritualWord.at. There is a time limit to file a claim for compensation. 7. How can I learn more?  Ask your doctor. He or she can give you the vaccine package insert or suggest other sources of information.  Call your local or state health department.  Contact the Centers for Disease Control and Prevention (CDC):  Call 801-375-2633 (1-800-CDC-INFO) or  Visit CDC's website at PicCapture.uy CDC Tdap Vaccine VIS (08/02/13) This information is not intended to replace advice given to you by your health care provider. Make sure you discuss any questions you have with your health care provider. Document Released: 11/25/2011 Document Revised: 02/14/2016 Document Reviewed: 02/14/2016 Elsevier Interactive Patient Education   2017 [employer].  Places to have your son circumcised:    [place] [phone] $480 by 4 wks  [facility] [phone] $244 by 4 wks  [facility] [phone] $175 by 2 wks  [facility] [phone] $250 by 7 days [facility] [phone] $150 by 4 wks  These prices sometimes change but are roughly what you can expect to pay. Please call and confirm pricing.   Circumcision is considered an elective/non-medically necessary procedure. There are many reasons parents decide to have their sons circumsized. During the first year of life circumcised males have a reduced risk of urinary tract infections but after this year the rates between circumcised males and uncircumcised males are the same.  It is safe to have your son circumcised outside of the hospital and the places above perform them regularly.

## 2016-07-19 LAB — GLUCOSE TOLERANCE, 2 HOURS W/ 1HR
GLUCOSE, 2 HOUR: 93 mg/dL (ref <140)
Glucose, 1 hour: 115 mg/dL
Glucose, Fasting: 87 mg/dL (ref 65–99)

## 2016-07-19 LAB — RPR

## 2016-07-19 LAB — HIV ANTIBODY (ROUTINE TESTING W REFLEX): HIV 1&2 Ab, 4th Generation: NONREACTIVE

## 2016-08-05 ENCOUNTER — Ambulatory Visit (INDEPENDENT_AMBULATORY_CARE_PROVIDER_SITE_OTHER): Payer: Medicaid Other | Admitting: Obstetrics & Gynecology

## 2016-08-05 DIAGNOSIS — Z3480 Encounter for supervision of other normal pregnancy, unspecified trimester: Secondary | ICD-10-CM

## 2016-08-05 DIAGNOSIS — R1909 Other intra-abdominal and pelvic swelling, mass and lump: Secondary | ICD-10-CM

## 2016-08-05 DIAGNOSIS — Z3483 Encounter for supervision of other normal pregnancy, third trimester: Secondary | ICD-10-CM

## 2016-08-05 MED ORDER — CETIRIZINE-PSEUDOEPHEDRINE ER 5-120 MG PO TB12: 1.0000 | ORAL_TABLET | Freq: Two times a day (BID) | ORAL | 0 refills | Status: AC

## 2016-08-05 NOTE — Progress Notes (Signed)
   PRENATAL VISIT NOTE  Subjective:  Madelynn DoneJkita Paske is a 31 y.o. [id number] at 3733w4d being seen today for ongoing prenatal care.  She is currently monitored for the following issues for this low-risk pregnancy and has Supervision of normal pregnancy, antepartum; Nausea and vomiting during pregnancy; Unwanted fertility; and Umbilical mass on her problem list.  Patient reports increased runny nose nad sinus pressure with allergies.  Claritin did not work..  Contractions: Irritability. Vag. Bleeding: None.  Movement: Present. Denies leaking of fluid.   The following portions of the patient's history were reviewed and updated as appropriate: allergies, current medications, past family history, past medical history, past social history, past surgical history and problem list. Problem list updated.  Objective:   Vitals:   08/05/16 1535  BP: 128/72  Pulse: (!) 101  Weight: 213 lb (96.6 kg)    Fetal Status: Fetal Heart Rate (bpm): 151   Movement: Present     General:  Alert, oriented and cooperative. Patient is in no acute distress.  Skin: Skin is warm and dry. No rash noted.   Cardiovascular: Normal heart rate noted  Respiratory: Normal respiratory effort, no problems with respiration noted  Abdomen: Soft, gravid, appropriate for gestational age. Pain/Pressure: Present     Pelvic:  Cervical exam deferred        Extremities: Normal range of motion.  Edema: Trace  Mental Status: Normal mood and affect. Normal behavior. Normal judgment and thought content.   Assessment and Plan:  Pregnancy: [id number] at 7133w4d  1.  Rhinorrhea and Nasal congestion Start OTC Zyrtec D.    2. Umbilical mass ?umbilical hernia.   - Ambulatory referral to General Surgery  Preterm labor symptoms and general obstetric precautions including but not limited to vaginal bleeding, contractions, leaking of fluid and fetal movement were reviewed in detail with the patient. Please refer to After Visit Summary for other  counseling recommendations.  Return in 2 weeks (on 08/19/2016).   Lesly DukesKelly H Judye Lorino, MD

## 2016-08-19 ENCOUNTER — Ambulatory Visit (INDEPENDENT_AMBULATORY_CARE_PROVIDER_SITE_OTHER): Payer: Medicaid Other | Admitting: Obstetrics & Gynecology

## 2016-08-19 VITALS — BP 108/64 | HR 117 | Wt 214.0 lb

## 2016-08-19 DIAGNOSIS — Z3480 Encounter for supervision of other normal pregnancy, unspecified trimester: Secondary | ICD-10-CM

## 2016-08-19 DIAGNOSIS — Z3483 Encounter for supervision of other normal pregnancy, third trimester: Secondary | ICD-10-CM

## 2016-08-19 NOTE — Progress Notes (Signed)
   PRENATAL VISIT NOTE  Subjective:  Michelle Fox is a 31 y.o. [id number] at 2532w4d being seen today for ongoing prenatal care.  She is currently monitored for the following issues for this low-risk pregnancy and has Supervision of normal pregnancy, antepartum; Nausea and vomiting during pregnancy; Unwanted fertility; and Umbilical mass on her problem list.  Patient reports no complaints.   .  .   . Denies leaking of fluid.   The following portions of the patient's history were reviewed and updated as appropriate: allergies, current medications, past family history, past medical history, past social history, past surgical history and problem list. Problem list updated.  Objective:  There were no vitals filed for this visit.  Fetal Status:           General:  Alert, oriented and cooperative. Patient is in no acute distress.  Skin: Skin is warm and dry. No rash noted.   Cardiovascular: Normal heart rate noted  Respiratory: Normal respiratory effort, no problems with respiration noted  Abdomen: Soft, gravid, appropriate for gestational age.  , umbilcal hernia present     Pelvic:  Cervical exam deferred        Extremities: Normal range of motion.     Mental Status: Normal mood and affect. Normal behavior. Normal judgment and thought content.   Assessment and Plan:  Pregnancy: [id number] at 2432w4d  1. Supervision of other normal pregnancy, antepartum   Preterm labor symptoms and general obstetric precautions including but not limited to vaginal bleeding, contractions, leaking of fluid and fetal movement were reviewed in detail with the patient. Please refer to After Visit Summary for other counseling recommendations.  No Follow-up on file.   She would like a BTL but will also need a hernia repair. I have suggested a depo provera shot after delivery, then a 6 week pp visit to the general surgeon and then coordination with gen surg to to hernia repair at the time of her interval tubal  ligation.   Allie BossierMyra C Autymn Omlor, MD '

## 2016-09-02 ENCOUNTER — Encounter: Payer: Self-pay | Admitting: *Deleted

## 2016-09-02 ENCOUNTER — Ambulatory Visit (INDEPENDENT_AMBULATORY_CARE_PROVIDER_SITE_OTHER): Payer: Medicaid Other | Admitting: Obstetrics & Gynecology

## 2016-09-02 VITALS — BP 130/80 | HR 109 | Wt 217.0 lb

## 2016-09-02 DIAGNOSIS — Z3483 Encounter for supervision of other normal pregnancy, third trimester: Secondary | ICD-10-CM

## 2016-09-02 DIAGNOSIS — O26843 Uterine size-date discrepancy, third trimester: Secondary | ICD-10-CM

## 2016-09-02 DIAGNOSIS — Z3480 Encounter for supervision of other normal pregnancy, unspecified trimester: Secondary | ICD-10-CM

## 2016-09-02 DIAGNOSIS — R309 Painful micturition, unspecified: Secondary | ICD-10-CM

## 2016-09-02 MED ORDER — HYDROCORTISONE 1 % EX CREA: 1.0000 "application " | TOPICAL_CREAM | Freq: Two times a day (BID) | CUTANEOUS | 0 refills | Status: DC

## 2016-09-02 MED ORDER — POLYETHYLENE GLYCOL 3350 17 GM/SCOOP PO POWD: 17.0000 g | Freq: Every day | ORAL | 0 refills | Status: DC

## 2016-09-02 NOTE — Progress Notes (Signed)
Pt states she is having pain on the lower left side of abdomen and she feels like she has to urinate frequently but, not much comes out when she goes to the bathroom. I dipped her urine and everything looked normal. No signs of a UTI. I am sending a urine culture.     PRENATAL VISIT NOTE  Subjective:  Michelle Fox is a 31 y.o. [id number] at 2537w4d being seen today for ongoing prenatal care.  She is currently monitored for the following issues for this low-risk pregnancy and has Supervision of normal pregnancy, antepartum; Nausea and vomiting during pregnancy; Unwanted fertility; and Umbilical mass on her problem list.  Patient reports pelvic discomfort.  Contractions: Irritability. Vag. Bleeding: None.  Movement: Present. Denies leaking of fluid.   The following portions of the patient's history were reviewed and updated as appropriate: allergies, current medications, past family history, past medical history, past social history, past surgical history and problem list. Problem list updated.  Objective:   Vitals:   09/02/16 1417  BP: 130/80  Pulse: (!) 109  Weight: 217 lb (98.4 kg)    Fetal Status: Fetal Heart Rate (bpm): 146   Movement: Present     General:  Alert, oriented and cooperative. Patient is in no acute distress.  Skin: Skin is warm and dry. No rash noted.   Cardiovascular: Normal heart rate noted  Respiratory: Normal respiratory effort, no problems with respiration noted  Abdomen: Soft, gravid, appropriate for gestational age. Pain/Pressure: Present     Pelvic:  Cervical exam deferred        Extremities: Normal range of motion.  Edema: Trace  Mental Status: Normal mood and affect. Normal behavior. Normal judgment and thought content.   Assessment and Plan:  Pregnancy: A5W0981G5P2022 at 6237w4d  1. Painful urination - Urine Culture  2. Uterine size date discrepancy pregnancy, third trimester - US MFM OB FOLLOW UP; Future  Preterm labor symptoms and general obstetric precautions  including but not limited to vaginal bleeding, contractions, leaking of fluid and fetal movement were reviewed in detail with the patient. Please refer to After Visit Summary for other counseling recommendations.  Return in about 1 week (around 09/09/2016).   Lesly DukesKelly H Shanterica Biehler, MD

## 2016-09-04 LAB — URINE CULTURE: Organism ID, Bacteria: NO GROWTH

## 2016-09-09 ENCOUNTER — Ambulatory Visit (INDEPENDENT_AMBULATORY_CARE_PROVIDER_SITE_OTHER): Payer: Medicaid Other | Admitting: Obstetrics & Gynecology

## 2016-09-09 ENCOUNTER — Other Ambulatory Visit (HOSPITAL_COMMUNITY)
Admission: RE | Admit: 2016-09-09 | Discharge: 2016-09-09 | Disposition: A | Discharge status: Home / Self Care | Payer: Medicaid Other | Source: Ambulatory Visit | Attending: Obstetrics & Gynecology | Admitting: Obstetrics & Gynecology

## 2016-09-09 VITALS — BP 140/85 | HR 111 | Wt 217.0 lb

## 2016-09-09 DIAGNOSIS — Z3483 Encounter for supervision of other normal pregnancy, third trimester: Secondary | ICD-10-CM

## 2016-09-09 DIAGNOSIS — Z113 Encounter for screening for infections with a predominantly sexual mode of transmission: Secondary | ICD-10-CM | POA: Diagnosis not present

## 2016-09-09 DIAGNOSIS — Z3480 Encounter for supervision of other normal pregnancy, unspecified trimester: Secondary | ICD-10-CM

## 2016-09-09 MED ORDER — HYDROCORTISONE ACE-PRAMOXINE 1-1 % RE FOAM: 1.0000 | Freq: Two times a day (BID) | RECTAL | 0 refills | Status: DC

## 2016-09-09 NOTE — Progress Notes (Signed)
   PRENATAL VISIT NOTE  Subjective:  Dalisha Shively is a 31 y.o. J1B1478 at [redacted]w[redacted]d being seen today for ongoing prenatal care.  She is currently monitored for the following issues for this low-risk pregnancy and has Supervision of normal pregnancy, antepartum; Nausea and vomiting during pregnancy; Unwanted fertility; and Umbilical mass on her problem list.  Patient reports no complaints.  Contractions: Irritability. Vag. Bleeding: None.  Movement: Present. Denies leaking of fluid.   The following portions of the patient's history were reviewed and updated as appropriate: allergies, current medications, past family history, past medical history, past social history, past surgical history and problem list. Problem list updated.  Objective:   Vitals:   09/09/16 1354  BP: 140/85  Pulse: (!) 111  Weight: 217 lb (98.4 kg)    Fetal Status:     Movement: Present     General:  Alert, oriented and cooperative. Patient is in no acute distress.  Skin: Skin is warm and dry. No rash noted.   Cardiovascular: Normal heart rate noted  Respiratory: Normal respiratory effort, no problems with respiration noted  Abdomen: Soft, gravid, appropriate for gestational age. Pain/Pressure: Present     Pelvic:  Cervical exam performed        Extremities: Normal range of motion.  Edema: Trace  Mental Status: Normal mood and affect. Normal behavior. Normal judgment and thought content.   Assessment and Plan:  Pregnancy: [id number] at [redacted]w[redacted]d  1. Supervision of other normal pregnancy, antepartum -Proctofoam prn - Cervical cultures today  Preterm labor symptoms and general obstetric precautions including but not limited to vaginal bleeding, contractions, leaking of fluid and fetal movement were reviewed in detail with the patient. Please refer to After Visit Summary for other counseling recommendations.  No Follow-up on file.   Allie Bossier, MD

## 2016-09-11 ENCOUNTER — Inpatient Hospital Stay (HOSPITAL_COMMUNITY)
Admission: AD | Admit: 2016-09-11 | Discharge: 2016-09-11 | Disposition: A | Discharge status: Home / Self Care | Payer: Medicaid Other | Source: Ambulatory Visit | Attending: Obstetrics & Gynecology | Admitting: Obstetrics & Gynecology

## 2016-09-11 ENCOUNTER — Encounter (HOSPITAL_COMMUNITY): Payer: Self-pay | Admitting: *Deleted

## 2016-09-11 DIAGNOSIS — Z3A00 Weeks of gestation of pregnancy not specified: Secondary | ICD-10-CM | POA: Diagnosis not present

## 2016-09-11 DIAGNOSIS — O479 False labor, unspecified: Secondary | ICD-10-CM | POA: Diagnosis present

## 2016-09-11 DIAGNOSIS — N898 Other specified noninflammatory disorders of vagina: Secondary | ICD-10-CM

## 2016-09-11 DIAGNOSIS — O26893 Other specified pregnancy related conditions, third trimester: Secondary | ICD-10-CM

## 2016-09-11 HISTORY — DX: Other specified health status: Z78.9

## 2016-09-11 LAB — GC/CHLAMYDIA PROBE AMP (~~LOC~~) NOT AT ARMC
Chlamydia: NEGATIVE
Neisseria Gonorrhea: NEGATIVE

## 2016-09-11 LAB — WET PREP, GENITAL
CLUE CELLS WET PREP: NONE SEEN
SPERM: NONE SEEN
TRICH WET PREP: NONE SEEN
YEAST WET PREP: NONE SEEN

## 2016-09-11 NOTE — Discharge Instructions (Signed)

## 2016-09-11 NOTE — MAU Note (Signed)
I have communicated with Wynelle Bourgeois, CNM and reviewed vital signs:  Vitals:   09/11/16 1411 09/11/16 1713  BP: 125/82 117/76  Pulse: 96 95  Resp: 20 19  Temp: 98.6 F (37 C)     Vaginal exam:  Dilation: 2.5 Effacement (%): 50 Cervical Position: Posterior Station: -2 Presentation: Vertex Exam by:: Danella Deis, RN,   Also reviewed contraction pattern and that non-stress test is reactive.  It has been documented that patient is contracting every 4-6 minutes with minimal cervical change over 2.5 hours not indicating active labor.  Patient denies any other complaints.  Based on this report provider has given order for discharge.  A discharge order and diagnosis entered by a provider.   Labor discharge instructions reviewed with patient.

## 2016-09-12 LAB — CULTURE, BETA STREP (GROUP B ONLY)

## 2016-09-15 ENCOUNTER — Ambulatory Visit (INDEPENDENT_AMBULATORY_CARE_PROVIDER_SITE_OTHER): Payer: Medicaid Other | Admitting: Obstetrics & Gynecology

## 2016-09-15 ENCOUNTER — Inpatient Hospital Stay (HOSPITAL_COMMUNITY)
Admission: AD | Admit: 2016-09-15 | Discharge: 2016-09-15 | Discharge status: Against Medical Advice | Payer: Medicaid Other | Source: Ambulatory Visit | Attending: Obstetrics & Gynecology | Admitting: Obstetrics & Gynecology

## 2016-09-15 VITALS — BP 129/83 | HR 102 | Wt 217.0 lb

## 2016-09-15 DIAGNOSIS — Z3483 Encounter for supervision of other normal pregnancy, third trimester: Secondary | ICD-10-CM | POA: Diagnosis not present

## 2016-09-15 DIAGNOSIS — Z3480 Encounter for supervision of other normal pregnancy, unspecified trimester: Secondary | ICD-10-CM

## 2016-09-15 NOTE — Progress Notes (Signed)
Patient called from lobby as she was walking out the door to leave after signing in.  Patient did not state why she was leaving or if she was coming back.

## 2016-09-15 NOTE — Progress Notes (Signed)
RN called for pt in lobby. Pt not in lobby. 

## 2016-09-15 NOTE — Progress Notes (Signed)
   PRENATAL VISIT NOTE  Subjective:  Michelle Fox is a 31 y.o. Z6X0960 at [redacted]w[redacted]d being seen today for ongoing prenatal care.  She is currently monitored for the following issues for this low-risk pregnancy and has Supervision of normal pregnancy, antepartum; Nausea and vomiting during pregnancy; Unwanted fertility; and Umbilical mass on her problem list.  Patient reports here for an unscheduled visit due to bad contractions..  Contractions: Regular. Vag. Bleeding: None.  Movement: Present. Denies leaking of fluid.   The following portions of the patient's history were reviewed and updated as appropriate: allergies, current medications, past family history, past medical history, past social history, past surgical history and problem list. Problem list updated.  Objective:   Vitals:   09/15/16 1323  BP: 129/83  Pulse: (!) 102  Weight: 217 lb (98.4 kg)    Fetal Status: Fetal Heart Rate (bpm): 142 Fundal Height: 37 cm Movement: Present     General:  Alert, oriented and cooperative. Patient is in no acute distress.  Skin: Skin is warm and dry. No rash noted.   Cardiovascular: Normal heart rate noted  Respiratory: Normal respiratory effort, no problems with respiration noted  Abdomen: Soft, gravid, appropriate for gestational age. Pain/Pressure: Present     Pelvic:  Cervical exam performed        Extremities: Normal range of motion.  Edema: Trace  Mental Status: Normal mood and affect. Normal behavior. Normal judgment and thought content.   Assessment and Plan:  Pregnancy: A5W0981 at 108w3d  1. Supervision of other normal pregnancy, antepartum To [facility] for L&D I notified Dr. Debroah Loop of + GBS  Term labor symptoms and general obstetric precautions including but not limited to vaginal bleeding, contractions, leaking of fluid and fetal movement were reviewed in detail with the patient. Please refer to After Visit Summary for other counseling recommendations.  No Follow-up on file.   Allie Bossier, MD

## 2016-09-15 NOTE — Progress Notes (Signed)
RN called for pt in lobby. Pt not in lobby.

## 2016-09-16 ENCOUNTER — Encounter: Payer: Medicaid Other | Admitting: Obstetrics & Gynecology

## 2016-09-17 ENCOUNTER — Encounter: Payer: Self-pay | Admitting: Obstetrics & Gynecology

## 2016-09-17 DIAGNOSIS — O99820 Streptococcus B carrier state complicating pregnancy: Secondary | ICD-10-CM | POA: Insufficient documentation

## 2016-09-18 ENCOUNTER — Ambulatory Visit (HOSPITAL_COMMUNITY): Payer: Medicaid Other

## 2016-10-27 ENCOUNTER — Encounter: Payer: Self-pay | Admitting: Certified Nurse Midwife

## 2016-10-27 ENCOUNTER — Encounter: Payer: Self-pay | Admitting: *Deleted

## 2016-10-27 ENCOUNTER — Ambulatory Visit (INDEPENDENT_AMBULATORY_CARE_PROVIDER_SITE_OTHER): Payer: Medicaid Other | Admitting: Certified Nurse Midwife

## 2016-10-27 NOTE — Progress Notes (Signed)
Post Partum Exam  Michelle Fox is a 31 y.o. [phone] female who presents for a postpartum visit. She is 6 weeks postpartum following a spontaneous vaginal delivery. I have fully reviewed the prenatal and intrapartum course. The delivery was at 581w3d gestational weeks.  Anesthesia: epidural. Postpartum course has been unremarkable. Baby's course has been unremarkable. Baby is feeding by breast and bottle  Similac Pro-advancedBleeding red. Bowel function is normal. Bladder function is normal. Patient is not sexually active. Contraception method is Depo-Provera injections. Postpartum depression screening:neg  The following portions of the patient's history were reviewed and updated as appropriate: allergies, current medications, past family history, past medical history, past social history, past surgical history and problem list.  Review of Systems Pertinent items are noted in HPI.    Objective:  Blood pressure 112/86, pulse 88, resp. rate 16, height 5\' 7"  (1.702 m), weight 193 lb (87.5 kg), last menstrual period 10/27/2016.  General:  alert, cooperative and no distress   Breasts:    Lungs: nml rate and effort  Heart:  nml rate  Abdomen:    Vulva:    Vagina:   Cervix:    Corpus:   Adnexa:    Rectal Exam:         Assessment:     Normal postpartum exam.  Umbilical hernia  Plan:   1. Contraception: Depo-Provera injections- had injection at hospital [facility] 2. General surgery referral for umbilical hernia, also desires BTL, could coordinate if needs surgical repair 3. Follow up in: 1 year or as needed.

## 2016-10-31 ENCOUNTER — Ambulatory Visit: Payer: Medicaid Other | Admitting: Family

## 2016-11-06 ENCOUNTER — Encounter: Payer: Self-pay | Admitting: *Deleted

## 2016-11-26 ENCOUNTER — Encounter: Payer: Self-pay | Admitting: *Deleted

## 2016-12-02 ENCOUNTER — Encounter (HOSPITAL_COMMUNITY): Payer: Self-pay

## 2016-12-02 ENCOUNTER — Telehealth: Payer: Self-pay | Admitting: *Deleted

## 2016-12-02 NOTE — Telephone Encounter (Signed)
Pt notified of BTS surgery date and she will stop by here before her surgery to resign her BTS papers.

## 2016-12-02 NOTE — Telephone Encounter (Signed)
-----   Message from Olevia BowensJacinda S Battle sent at 12/02/2016  9:40 AM EDT ----- Regarding: Surgery Done. Posted for 02/02/2017 @1245  w/ Penne LashLeggett & H-Smith CPT [id number]    Mervyn GayLora,  Please make sure this patient re-signs her BTL paper (no later than 07/26), her current BTL papers will have expired by the time of surgery.  Thanks,  Rhea Bleacher-Jacinda   ----- Message ----- From: Lesly DukesLeggett, Kelly H, MD Sent: 11/28/2016  11:31 AM To: Granville LewisLora L Clark, RN, Olevia BowensJacinda S Battle, #  This patient needs a laparoscopic btl  She has umbilical hernia nad need LUQ entry.  Dr. Shelly BombardHS is going to help me (thanks!!).  Please schedule.

## 2016-12-17 ENCOUNTER — Encounter: Payer: Self-pay | Admitting: *Deleted

## 2017-02-02 ENCOUNTER — Ambulatory Visit (HOSPITAL_COMMUNITY)
Admission: RE | Admit: 2017-02-02 | Payer: Medicaid Other | Source: Ambulatory Visit | Admitting: Obstetrics & Gynecology

## 2017-02-02 ENCOUNTER — Encounter (HOSPITAL_COMMUNITY): Admission: RE | Payer: Self-pay | Source: Ambulatory Visit

## 2017-02-02 SURGERY — LIGATION, FALLOPIAN TUBE, LAPAROSCOPIC
Anesthesia: Choice | Laterality: Bilateral

## 2017-02-03 ENCOUNTER — Encounter (HOSPITAL_COMMUNITY): Payer: Self-pay

## 2017-02-04 ENCOUNTER — Telehealth: Payer: Self-pay | Admitting: *Deleted

## 2017-02-04 DIAGNOSIS — Z30013 Encounter for initial prescription of injectable contraceptive: Secondary | ICD-10-CM

## 2017-02-04 MED ORDER — MEDROXYPROGESTERONE ACETATE 150 MG/ML IM SUSP: 150.0000 mg | INTRAMUSCULAR | 0 refills | Status: DC

## 2017-02-04 NOTE — Telephone Encounter (Signed)
Pt called stating that her BTS was rescheduled for Oct and she is on her cycle now.   She would like to get a Depo Provera injection now since the surgery was postponed.  [facility] [place] and pt will come in for her injection tomorrow per Dr Marice Potterove.

## 2017-02-05 ENCOUNTER — Ambulatory Visit (INDEPENDENT_AMBULATORY_CARE_PROVIDER_SITE_OTHER): Payer: Medicaid Other | Admitting: *Deleted

## 2017-02-05 DIAGNOSIS — Z3042 Encounter for surveillance of injectable contraceptive: Secondary | ICD-10-CM | POA: Diagnosis not present

## 2017-02-05 DIAGNOSIS — Z309 Encounter for contraceptive management, unspecified: Secondary | ICD-10-CM

## 2017-02-05 MED ORDER — MEDROXYPROGESTERONE ACETATE 150 MG/ML IM SUSP
150.0000 mg | Freq: Once | INTRAMUSCULAR | Status: AC
  Administered 2017-02-05: 150 mg via INTRAMUSCULAR

## 2017-03-02 ENCOUNTER — Encounter: Payer: Self-pay | Admitting: Obstetrics & Gynecology

## 2017-03-06 NOTE — Patient Instructions (Addendum)
Your procedure is scheduled on:  Tuesday, Oct. 16th at 12:15 pm  Enter through the Main Entrance of [facility] at: 10:45 am  Pick up the phone at the desk and dial [phone].  Call this number if you have problems the morning of surgery: [phone].  Remember: Do NOT eat food after midnight Monday  Do NOT drink clear liquids (including water) after midnight Monday  Take these medicines the morning of surgery with a SIP OF WATER: none  Do NOT wear jewelry (body piercing), metal hair clips/bobby pins, make-up, or nail polish. Do NOT wear lotions, powders, or perfumes.  You may wear deoderant. Do NOT shave for 48 hours prior to surgery. Do NOT bring valuables to the hospital.   Have a responsible adult drive you home and stay with you for 24 hours after your procedure .  Home with a family member or friend.

## 2017-03-11 ENCOUNTER — Telehealth (HOSPITAL_COMMUNITY): Payer: Self-pay

## 2017-03-11 NOTE — Telephone Encounter (Signed)
Called Michelle Fox to let her know that Dr. Penne Lash would be out of town on the day that she was scheduled for surgery. Informed her that she could still have her surgery done, but with a different provider, or she could reschedule on a different day with Dr. Penne Lash. Patient expressed that she has already missed at lot of time with work and didn't want to have to keep asking for time off so she would proceed with her original surgery date w/ a different provider. Offered to have her meet w/ Dr. Erin Fulling in the office before her surgery and she declined, states she would pray on it and be fine. Advised patient that we would see her on 10/16 for her surgery and instructed her to call us back if she had any questions. Patient expressed understanding.

## 2017-03-13 ENCOUNTER — Encounter (HOSPITAL_COMMUNITY): Payer: Self-pay

## 2017-03-13 ENCOUNTER — Encounter (HOSPITAL_COMMUNITY)
Admission: RE | Admit: 2017-03-13 | Discharge: 2017-03-13 | Disposition: A | Discharge status: Home / Self Care | Payer: Medicaid Other | Source: Ambulatory Visit | Attending: Obstetrics & Gynecology | Admitting: Obstetrics & Gynecology

## 2017-03-13 DIAGNOSIS — Z302 Encounter for sterilization: Secondary | ICD-10-CM | POA: Insufficient documentation

## 2017-03-13 DIAGNOSIS — Z01812 Encounter for preprocedural laboratory examination: Secondary | ICD-10-CM | POA: Diagnosis not present

## 2017-03-13 LAB — CBC WITH DIFFERENTIAL/PLATELET
BASOS ABS: 0 10*3/uL (ref 0.0–0.1)
Basophils Relative: 0 %
EOS PCT: 4 %
Eosinophils Absolute: 0.3 10*3/uL (ref 0.0–0.7)
HEMATOCRIT: 39.7 % (ref 36.0–46.0)
Hemoglobin: 13.5 g/dL (ref 12.0–15.0)
LYMPHS PCT: 27 %
Lymphs Abs: 2.2 10*3/uL (ref 0.7–4.0)
MCH: 28.2 pg (ref 26.0–34.0)
MCHC: 34 g/dL (ref 30.0–36.0)
MCV: 82.9 fL (ref 78.0–100.0)
MONO ABS: 0.5 10*3/uL (ref 0.1–1.0)
MONOS PCT: 6 %
NEUTROS ABS: 5.2 10*3/uL (ref 1.7–7.7)
Neutrophils Relative %: 63 %
PLATELETS: 287 10*3/uL (ref 150–400)
RBC: 4.79 MIL/uL (ref 3.87–5.11)
RDW: 13.8 % (ref 11.5–15.5)
WBC: 8.3 10*3/uL (ref 4.0–10.5)

## 2017-03-16 ENCOUNTER — Telehealth: Payer: Self-pay | Admitting: Obstetrics & Gynecology

## 2017-03-16 NOTE — Telephone Encounter (Signed)
TC to pt to discuss her surgery on Oct 16th. Pt is a [id number] who  Is scheduled for a lap BTL.  Pt was dx'd with an umbilical hernia after her 3rd pregnancy so a PP BTL was not done. She was sent to gen surgery and was told that this was very small and would not be a problem and would not limit lap surgery.  She reports no sx from this area.  Patient desires surgical management with lap BTL.  The risks of surgery were discussed in detail with the patient including but not limited to: bleeding which may require transfusion or reoperation; infection which may require prolonged hospitalization or re-hospitalization and antibiotic therapy; injury to bowel, bladder, ureters and major vessels or other surrounding organs; need for additional procedures including laparotomy; thromboembolic phenomenon, incisional problems and other postoperative or anesthesia complications.  Patient was told that the likelihood that her condition and symptoms will be treated effectively with this surgical management was very high a 3-09/998 risk of failure and an increased risk of ectopic pregnancies should pregnancy occur.  The postoperative expectations were also discussed in detail. The patient also understands the alternative treatment options which were discussed in full. All questions were answered.   Ourania Hamler L. Harraway-Smith, M.D., Evern Core

## 2017-03-24 ENCOUNTER — Ambulatory Visit (HOSPITAL_COMMUNITY)
Admission: AD | Admit: 2017-03-24 | Discharge: 2017-03-24 | Disposition: A | Discharge status: Home / Self Care | Payer: Medicaid Other | Source: Ambulatory Visit | Attending: Obstetrics & Gynecology | Admitting: Obstetrics & Gynecology

## 2017-03-24 ENCOUNTER — Ambulatory Visit (HOSPITAL_COMMUNITY): Payer: Medicaid Other | Admitting: Anesthesiology

## 2017-03-24 ENCOUNTER — Encounter (HOSPITAL_COMMUNITY): Payer: Self-pay

## 2017-03-24 ENCOUNTER — Encounter (HOSPITAL_COMMUNITY)
Admission: AD | Disposition: A | Discharge status: Home / Self Care | Payer: Self-pay | Source: Ambulatory Visit | Attending: Obstetrics & Gynecology

## 2017-03-24 DIAGNOSIS — Z302 Encounter for sterilization: Secondary | ICD-10-CM | POA: Diagnosis present

## 2017-03-24 DIAGNOSIS — Z3009 Encounter for other general counseling and advice on contraception: Secondary | ICD-10-CM | POA: Diagnosis present

## 2017-03-24 HISTORY — PX: LAPAROSCOPIC TUBAL LIGATION: SHX1937

## 2017-03-24 LAB — PREGNANCY, URINE: Preg Test, Ur: NEGATIVE

## 2017-03-24 SURGERY — LIGATION, FALLOPIAN TUBE, LAPAROSCOPIC
Anesthesia: General | Laterality: Bilateral

## 2017-03-24 MED ORDER — FENTANYL CITRATE (PF) 100 MCG/2ML IJ SOLN
25.0000 ug | INTRAMUSCULAR | Status: DC | PRN
  Administered 2017-03-24 (×3): 50 ug via INTRAVENOUS

## 2017-03-24 MED ORDER — ROCURONIUM BROMIDE 100 MG/10ML IV SOLN
INTRAVENOUS | Status: DC | PRN
  Administered 2017-03-24: 40 mg via INTRAVENOUS

## 2017-03-24 MED ORDER — LACTATED RINGERS IV SOLN
INTRAVENOUS | Status: DC
  Administered 2017-03-24: 11:00:00 via INTRAVENOUS

## 2017-03-24 MED ORDER — OXYCODONE HCL 5 MG PO TABS
5.0000 mg | ORAL_TABLET | Freq: Once | ORAL | Status: AC
Start: 2017-03-24 — End: 2017-03-24
  Administered 2017-03-24: 5 mg via ORAL

## 2017-03-24 MED ORDER — SODIUM CHLORIDE 0.9 % IJ SOLN
INTRAMUSCULAR | Status: DC | PRN
  Administered 2017-03-24: 3 mL via INTRAVENOUS

## 2017-03-24 MED ORDER — DEXAMETHASONE SODIUM PHOSPHATE 4 MG/ML IJ SOLN
INTRAMUSCULAR | Status: DC | PRN
  Administered 2017-03-24: 4 mg via INTRAVENOUS

## 2017-03-24 MED ORDER — ACETAMINOPHEN 500 MG PO TABS
1000.0000 mg | ORAL_TABLET | Freq: Once | ORAL | Status: AC
  Administered 2017-03-24: 1000 mg via ORAL

## 2017-03-24 MED ORDER — FENTANYL CITRATE (PF) 250 MCG/5ML IJ SOLN
INTRAMUSCULAR | Status: AC
  Filled 2017-03-24: qty 5

## 2017-03-24 MED ORDER — BUPIVACAINE HCL 0.5 % IJ SOLN
INTRAMUSCULAR | Status: DC | PRN
  Administered 2017-03-24: 30 mL

## 2017-03-24 MED ORDER — SCOPOLAMINE 1 MG/3DAYS TD PT72
1.0000 | MEDICATED_PATCH | Freq: Once | TRANSDERMAL | Status: DC
  Administered 2017-03-24: 1.5 mg via TRANSDERMAL

## 2017-03-24 MED ORDER — PROPOFOL 10 MG/ML IV BOLUS
INTRAVENOUS | Status: AC
  Filled 2017-03-24: qty 20

## 2017-03-24 MED ORDER — CEFAZOLIN SODIUM-DEXTROSE 2-3 GM-%(50ML) IV SOLR
INTRAVENOUS | Status: DC | PRN
  Administered 2017-03-24: 2 g via INTRAVENOUS

## 2017-03-24 MED ORDER — LIDOCAINE HCL (CARDIAC) 20 MG/ML IV SOLN
INTRAVENOUS | Status: DC | PRN
  Administered 2017-03-24: 100 mg via INTRAVENOUS

## 2017-03-24 MED ORDER — KETOROLAC TROMETHAMINE 30 MG/ML IJ SOLN
INTRAMUSCULAR | Status: DC | PRN
  Administered 2017-03-24: 30 mg via INTRAVENOUS

## 2017-03-24 MED ORDER — PROPOFOL 10 MG/ML IV BOLUS
INTRAVENOUS | Status: DC | PRN
Start: 2017-03-24 — End: 2017-03-24
  Administered 2017-03-24: 30 mg via INTRAVENOUS
  Administered 2017-03-24: 150 mg via INTRAVENOUS

## 2017-03-24 MED ORDER — FENTANYL CITRATE (PF) 100 MCG/2ML IJ SOLN
INTRAMUSCULAR | Status: AC
  Filled 2017-03-24: qty 2

## 2017-03-24 MED ORDER — DEXAMETHASONE SODIUM PHOSPHATE 4 MG/ML IJ SOLN
INTRAMUSCULAR | Status: AC
  Filled 2017-03-24: qty 1

## 2017-03-24 MED ORDER — ACETAMINOPHEN 500 MG PO TABS
ORAL_TABLET | ORAL | Status: AC
  Administered 2017-03-24: 1000 mg via ORAL
  Filled 2017-03-24: qty 2

## 2017-03-24 MED ORDER — GLYCOPYRROLATE 0.2 MG/ML IJ SOLN
INTRAMUSCULAR | Status: DC | PRN
  Administered 2017-03-24: 0.2 mg via INTRAVENOUS

## 2017-03-24 MED ORDER — LIDOCAINE HCL (CARDIAC) 20 MG/ML IV SOLN
INTRAVENOUS | Status: AC
  Filled 2017-03-24: qty 5

## 2017-03-24 MED ORDER — ONDANSETRON HCL 4 MG/2ML IJ SOLN
INTRAMUSCULAR | Status: DC | PRN
  Administered 2017-03-24: 4 mg via INTRAVENOUS

## 2017-03-24 MED ORDER — FENTANYL CITRATE (PF) 100 MCG/2ML IJ SOLN
INTRAMUSCULAR | Status: AC
  Administered 2017-03-24: 50 ug via INTRAVENOUS
  Filled 2017-03-24: qty 2

## 2017-03-24 MED ORDER — SCOPOLAMINE 1 MG/3DAYS TD PT72
MEDICATED_PATCH | TRANSDERMAL | Status: AC
  Administered 2017-03-24: 1.5 mg via TRANSDERMAL
  Filled 2017-03-24: qty 1

## 2017-03-24 MED ORDER — IBUPROFEN 800 MG PO TABS: 800.0000 mg | ORAL_TABLET | Freq: Every day | ORAL | 0 refills | Status: DC | PRN

## 2017-03-24 MED ORDER — SUGAMMADEX SODIUM 200 MG/2ML IV SOLN
INTRAVENOUS | Status: AC
  Filled 2017-03-24: qty 2

## 2017-03-24 MED ORDER — BUPIVACAINE HCL (PF) 0.5 % IJ SOLN
INTRAMUSCULAR | Status: AC
  Filled 2017-03-24: qty 30

## 2017-03-24 MED ORDER — FENTANYL CITRATE (PF) 100 MCG/2ML IJ SOLN
INTRAMUSCULAR | Status: DC | PRN
  Administered 2017-03-24 (×2): 50 ug via INTRAVENOUS
  Administered 2017-03-24: 100 ug via INTRAVENOUS

## 2017-03-24 MED ORDER — ROCURONIUM BROMIDE 100 MG/10ML IV SOLN
INTRAVENOUS | Status: AC
  Filled 2017-03-24: qty 1

## 2017-03-24 MED ORDER — PROMETHAZINE HCL 25 MG/ML IJ SOLN: 6.2500 mg | INTRAMUSCULAR | Status: DC | PRN

## 2017-03-24 MED ORDER — MIDAZOLAM HCL 2 MG/2ML IJ SOLN
INTRAMUSCULAR | Status: DC | PRN
  Administered 2017-03-24: 2 mg via INTRAVENOUS

## 2017-03-24 MED ORDER — MIDAZOLAM HCL 2 MG/2ML IJ SOLN
INTRAMUSCULAR | Status: AC
  Filled 2017-03-24: qty 2

## 2017-03-24 MED ORDER — OXYCODONE HCL 5 MG PO TABS
ORAL_TABLET | ORAL | Status: AC
  Filled 2017-03-24: qty 1

## 2017-03-24 MED ORDER — SUGAMMADEX SODIUM 200 MG/2ML IV SOLN
INTRAVENOUS | Status: DC | PRN
  Administered 2017-03-24: 200 mg via INTRAVENOUS

## 2017-03-24 MED ORDER — KETOROLAC TROMETHAMINE 30 MG/ML IJ SOLN
INTRAMUSCULAR | Status: AC
  Filled 2017-03-24: qty 1

## 2017-03-24 MED ORDER — ONDANSETRON HCL 4 MG/2ML IJ SOLN
INTRAMUSCULAR | Status: AC
  Filled 2017-03-24: qty 2

## 2017-03-24 MED ORDER — SODIUM CHLORIDE 0.9 % IJ SOLN
INTRAMUSCULAR | Status: AC
  Filled 2017-03-24: qty 10

## 2017-03-24 SURGICAL SUPPLY — 27 items
CATH ROBINSON RED A/P 16FR (CATHETERS) ×3 IMPLANT
CLIP FILSHIE TUBAL LIGA STRL (Clip) ×3 IMPLANT
CLOTH BEACON ORANGE TIMEOUT ST (SAFETY) ×3 IMPLANT
DERMABOND ADHESIVE PROPEN (GAUZE/BANDAGES/DRESSINGS) ×2
DERMABOND ADVANCED .7 DNX6 (GAUZE/BANDAGES/DRESSINGS) ×1 IMPLANT
DRSG OPSITE POSTOP 3X4 (GAUZE/BANDAGES/DRESSINGS) ×3 IMPLANT
DURAPREP 26ML APPLICATOR (WOUND CARE) ×3 IMPLANT
GLOVE BIO SURGEON STRL SZ7 (GLOVE) ×3 IMPLANT
GLOVE BIOGEL PI IND STRL 7.0 (GLOVE) ×3 IMPLANT
GLOVE BIOGEL PI INDICATOR 7.0 (GLOVE) ×6
GOWN STRL REUS W/TWL LRG LVL3 (GOWN DISPOSABLE) ×3 IMPLANT
GOWN STRL REUS W/TWL XL LVL3 (GOWN DISPOSABLE) ×3 IMPLANT
MANIPULATOR UTERINE 4.5 ZUMI (MISCELLANEOUS) ×3 IMPLANT
NEEDLE INSUFFLATION 120MM (ENDOMECHANICALS) ×3 IMPLANT
PACK LAPAROSCOPY BASIN (CUSTOM PROCEDURE TRAY) ×3 IMPLANT
PACK TRENDGUARD 450 HYBRID PRO (MISCELLANEOUS) IMPLANT
PACK TRENDGUARD 600 HYBRD PROC (MISCELLANEOUS) IMPLANT
PROTECTOR NERVE ULNAR (MISCELLANEOUS) ×6 IMPLANT
SUT VIC AB 3-0 PS2 18 (SUTURE) ×3 IMPLANT
SUT VICRYL 0 UR6 27IN ABS (SUTURE) ×3 IMPLANT
SYRINGE 10CC LL (SYRINGE) ×3 IMPLANT
TOWEL OR 17X24 6PK STRL BLUE (TOWEL DISPOSABLE) ×6 IMPLANT
TRENDGUARD 450 HYBRID PRO PACK (MISCELLANEOUS)
TRENDGUARD 600 HYBRID PROC PK (MISCELLANEOUS)
TROCAR XCEL DIL TIP R 11M (ENDOMECHANICALS) ×3 IMPLANT
TROCAR XCEL NON-BLD 5MMX100MML (ENDOMECHANICALS) IMPLANT
WARMER LAPAROSCOPE (MISCELLANEOUS) ×3 IMPLANT

## 2017-03-24 NOTE — Discharge Instructions (Signed)
DISCHARGE INSTRUCTIONS: Laparoscopy ° °The following instructions have been prepared to help you care for yourself upon your return home today. ° °Wound care: °• Do not get the incision wet for the first 24 hours. The incision should be kept clean and dry. °• The Band-Aids or dressings may be removed the day after surgery. °• Should the incision become sore, red, and swollen after the first week, check with your doctor. ° °Personal hygiene: °• Shower the day after your procedure. ° °Activity and limitations: °• Do NOT drive or operate any equipment today. °• Do NOT lift anything more than 15 pounds for 2-3 weeks after surgery. °• Do NOT rest in bed all day. °• Walking is encouraged. Walk each day, starting slowly with 5-minute walks 3 or 4 times a day. Slowly increase the length of your walks. °• Walk up and down stairs slowly. °• Do NOT do strenuous activities, such as golfing, playing tennis, bowling, running, biking, weight lifting, gardening, mowing, or vacuuming for 2-4 weeks. Ask your doctor when it is okay to start. ° °Diet: Eat a light meal as desired this evening. You may resume your usual diet tomorrow. ° °Return to work: This is dependent on the type of work you do. For the most part you can return to a desk job within a week of surgery. If you are more active at work, please discuss this with your doctor. ° °What to expect after your surgery: You may have a slight burning sensation when you urinate on the first day. You may have a very small amount of blood in the urine. Expect to have a small amount of vaginal discharge/light bleeding for 1-2 weeks. It is not unusual to have abdominal soreness and bruising for up to 2 weeks. You may be tired and need more rest for about 1 week. You may experience shoulder pain for 24-72 hours. Lying flat in bed may relieve it. ° °Call your doctor for any of the following: °• Develop a fever of 100.4 or greater °• Inability to urinate 6 hours after discharge from  hospital °• Severe pain not relieved by pain medications °• Persistent of heavy bleeding at incision site °• Redness or swelling around incision site after a week °• Increasing nausea or vomiting ° °Patient Signature________________________________________ °Nurse Signature_________________________________________Laparoscopic Tubal Ligation, Care After °Refer to this sheet in the next few weeks. These instructions provide you with information about caring for yourself after your procedure. Your health care provider may also give you more specific instructions. Your treatment has been planned according to current medical practices, but problems sometimes occur. Call your health care provider if you have any problems or questions after your procedure. °What can I expect after the procedure? °After the procedure, it is common to have: °· A sore throat. °· Discomfort in your shoulder. °· Mild discomfort or cramping in your abdomen. °· Gas pains. °· Pain or soreness in the area where the surgical cut (incision) was made. °· A bloated feeling. °· Tiredness. °· Nausea. °· Vomiting. ° °Follow these instructions at home: °Medicines °· Take over-the-counter and prescription medicines only as told by your health care provider. °· Do not take aspirin because it can cause bleeding. °· Do not drive or operate heavy machinery while taking prescription pain medicine. °Activity °· Rest for the rest of the day. °· Return to your normal activities as told by your health care provider. Ask your health care provider what activities are safe for you. °Incision care ° °·   Follow instructions from your health care provider about how to take care of your incision. Make sure you: °? Wash your hands with soap and water before you change your bandage (dressing). If soap and water are not available, use hand sanitizer. °? Change your dressing as told by your health care provider. °? Leave stitches (sutures) in place. They may need to stay in  place for 2 weeks or longer. °· Check your incision area every day for signs of infection. Check for: °? More redness, swelling, or pain. °? More fluid or blood. °? Warmth. °? Pus or a bad smell. °Other Instructions °· Do not take baths, swim, or use a hot tub until your health care provider approves. You may take showers. °· Keep all follow-up visits as told by your health care provider. This is important. °· Have someone help you with your daily household tasks for the first few days. °Contact a health care provider if: °· You have more redness, swelling, or pain around your incision. °· Your incision feels warm to the touch. °· You have pus or a bad smell coming from your incision. °· The edges of your incision break open after the sutures have been removed. °· Your pain does not improve after 2-3 days. °· You have a rash. °· You repeatedly become dizzy or light-headed. °· Your pain medicine is not helping. °· You are constipated. °Get help right away if: °· You have a fever. °· You faint. °· You have increasing pain in your abdomen. °· You have severe pain in one or both of your shoulders. °· You have fluid or blood coming from your sutures or from your vagina. °· You have shortness of breath or difficulty breathing. °· You have chest pain or leg pain. °· You have ongoing nausea, vomiting, or diarrhea. °This information is not intended to replace advice given to you by your health care provider. Make sure you discuss any questions you have with your health care provider. °Document Released: 12/13/2004 Document Revised: 10/29/2015 Document Reviewed: 05/06/2015 °Elsevier Interactive Patient Education © 2018 [employer] ° °

## 2017-03-24 NOTE — Transfer of Care (Signed)
Immediate Anesthesia Transfer of Care Note  Patient: Michelle Fox  Procedure(s) Performed: LAPAROSCOPIC TUBAL LIGATION Filshie Clips (Bilateral )  Patient Location: PACU  Anesthesia Type:General  Level of Consciousness: awake and alert   Airway & Oxygen Therapy: Patient Spontanous Breathing and Patient connected to nasal cannula oxygen  Post-op Assessment: Report given to RN and Post -op Vital signs reviewed and stable  Post vital signs: Reviewed  Last Vitals:  Vitals:   03/24/17 1047  BP: (!) 125/92  Pulse: 77  Resp: 16  Temp: 36.9 C  SpO2: 100%    Last Pain:  Vitals:   03/24/17 1047  TempSrc: Oral      Patients Stated Pain Goal: 3 (03/24/17 1047)  Complications: No apparent anesthesia complications

## 2017-03-24 NOTE — H&P (Signed)
Preoperative History and Physical  Michelle Fox is a 31 y.o. [id number] here for surgical management of elective sterilization.   Proposed surgery: laparoscopic bilateral tubal ligation  Past Medical History:  Diagnosis Date  . Medical history non-contributory    Past Surgical History:  Procedure Laterality Date  . NO PAST SURGERIES    . WISDOM TOOTH EXTRACTION  2017   OB History    Gravida Para Term Preterm AB Living   SAB TAB Ectopic Multiple Live Births     2     2     Patient denies any cervical dysplasia or STIs. Prescriptions Prior to Admission  Medication Sig Dispense Refill Last Dose  . cycloSPORINE (RESTASIS) 0.05 % ophthalmic emulsion Place 1 drop into both eyes daily as needed (irritation).   Past Month at Unknown time  . ibuprofen (ADVIL,MOTRIN) 800 MG tablet Take 800 mg by mouth daily as needed for headache.   Past Month at Unknown time  . medroxyPROGESTERone (DEPO-PROVERA) 150 MG/ML injection Inject 1 mL (150 mg total) into the muscle every 3 (three) months. (Patient taking differently: Inject 150 mg into the muscle every 3 (three) months. Last injection was August 2018) 1 mL 0   . prednisoLONE acetate (PRED FORTE) 1 % ophthalmic suspension Place 1 drop into both eyes daily as needed (allergies).   Past Month at Unknown time  . cetirizine-pseudoephedrine (ZYRTEC-D) 5-120 MG tablet Take 1 tablet by mouth 2 (two) times daily. (Patient not taking: Reported on 03/10/2017) 20 tablet 0 Not Taking at Unknown time    No Known Allergies Social History:   reports that she has never smoked. She has never used smokeless tobacco. She reports that she drinks alcohol. She reports that she does not use drugs. Family History  Problem Relation Age of Onset  . Diabetes Maternal Grandmother   . Diabetes Maternal Grandfather   . Diabetes Paternal Grandmother   . Diabetes Paternal Grandfather     Review of Systems: Noncontributory  PHYSICAL EXAM: unknown if currently  breastfeeding. General appearance - alert, well appearing, and in no distress Chest - clear to auscultation, no wheezes, rales or rhonchi, symmetric air entry Heart - normal rate and regular rhythm Abdomen - soft, nontender, nondistended, no masses or organomegaly Pelvic - examination not indicated Extremities - peripheral pulses normal, no pedal edema, no clubbing or cyanosis  Labs: Results for orders placed or performed during the hospital encounter of 03/13/17 (from the past 336 hour(s))  CBC WITH DIFFERENTIAL   Collection Time: 03/13/17  4:30 PM  Result Value Ref Range   WBC 8.3 4.0 - 10.5 K/uL   RBC 4.79 3.87 - 5.11 MIL/uL   Hemoglobin 13.5 12.0 - 15.0 g/dL   HCT 98.1 19.1 - 47.8 %   MCV 82.9 78.0 - 100.0 fL   MCH 28.2 26.0 - 34.0 pg   MCHC 34.0 30.0 - 36.0 g/dL   RDW 29.5 62.1 - 30.8 %   Platelets 287 150 - 400 K/uL   Neutrophils Relative % 63 %   Neutro Abs 5.2 1.7 - 7.7 K/uL   Lymphocytes Relative 27 %   Lymphs Abs 2.2 0.7 - 4.0 K/uL   Monocytes Relative 6 %   Monocytes Absolute 0.5 0.1 - 1.0 K/uL   Eosinophils Relative 4 %   Eosinophils Absolute 0.3 0.0 - 0.7 K/uL   Basophils Relative 0 %   Basophils Absolute 0.0 0.0 - 0.1 K/uL    Imaging  Studies: No results found.  Assessment: Patient Active Problem List   Diagnosis Date Noted  . Umbilical mass 08/05/2016  . 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   The risks of surgery were discussed in detail with the patient including but not limited to: bleeding which may require transfusion or reoperation; infection which may require antibiotics; injury to surrounding organs which may involve bowel, bladder, ureters ; need for additional procedures including laparoscopy or laparotomy; thromboembolic phenomenon, surgical site problems and other postoperative/anesthesia complications. Likelihood of success in alleviating the patient's  condition was discussed. I reviewed a failure rate of 3-09/998 and an increased rate of ectopic pregnancies should a pregnancy occur.  Routine postoperative instructions will be reviewed with the patient and her family in detail after surgery.  The patient concurred with the proposed plan, giving informed written consent for the surgery.  Patient has been NPO since last night she will remain NPO for procedure.  Anesthesia and OR aware.  Preoperative prophylactic antibiotics and SCDs ordered on call to the OR.  To OR when ready.  Nivek Powley L. Erin Fulling, M.D., [facility] 03/24/2017 10:44 AM

## 2017-03-24 NOTE — Op Note (Signed)
03/24/2017  1:19 PM  PATIENT:  Michelle Fox  31 y.o. female  PRE-OPERATIVE DIAGNOSIS:  Undesired Fertility  POST-OPERATIVE DIAGNOSIS:  Undesired Fertility  PROCEDURE:  Procedure(s): LAPAROSCOPIC TUBAL LIGATION Filshie Clips (Bilateral)  SURGEON:  Surgeon(s) and Role:    * Willodean Rosenthal, MD - Primary  ANESTHESIA:   general  EBL:  Total I/O In: -  Out: 30 [Urine:15; Blood:15]  BLOOD ADMINISTERED:none  DRAINS: none   LOCAL MEDICATIONS USED:  MARCAINE     SPECIMEN:  No Specimen  DISPOSITION OF SPECIMEN:  N/A  COUNTS:  YES  TOURNIQUET:  * No tourniquets in log *  DICTATION: .Note written in EPIC  PLAN OF CARE: Discharge to home after PACU  PATIENT DISPOSITION:  PACU - hemodynamically stable.   Delay start of Pharmacological VTE agent (>24hrs) due to surgical blood loss or risk of bleeding: not applicable  Complications: none immediate    INDICATIONS: 31 y.o. [id number]  with undesired fertility, desires permanent sterilization. Other reversible forms of contraception were discussed with patient; she declines all other modalities.  Risks of procedure discussed with patient including permanence of method, bleeding, infection, injury to surrounding organs and need for additional procedures including laparotomy, risk of regret.  Failure risk of 0.5-1% with increased risk of ectopic gestation if pregnancy occurs was also discussed with patient.  Patient was referred to me due to an umbilical hernia that might complicate her procedure.  This was evaluated and found to be very small and not in a location to interfere with her procedure.     FINDINGS:  Normal uterus, tubes, and ovaries.  TECHNIQUE:  The patient was taken to the operating room where general anesthesia was obtained without difficulty.  She was then placed in the dorsal lithotomy position and prepared and draped in sterile fashion.  After an adequate timeout was performed, a bivalved speculum was then placed  in the patient's vagina, and the anterior lip of cervix grasped with the single-tooth tenaculum.  The uterine manipulator was then advanced into the uterus.  The speculum was removed from the vagina.  Attention was then turned to the patient's abdomen where a 11-mm skin incision was made in the umbilical fold.  The Veress needle was carefully introduced into the peritoneal cavity through the abdominal wall.  Intraperitoneal placement was confirmed by drop in intraabdominal pressure with insufflation of carbon dioxide gas.  Adequate pneumoperitoneum was obtained, and the 11-mm trocar and sleeve were then advanced without difficulty into the abdomen where intraabdominal placement was confirmed by the operative laparoscope. A survey of the patient's pelvis and abdomen revealed entirely normal anatomy.  The fallopian tubes were observed and found to be normal in appearance. The Filshie clip applicator was placed through the operative port, and a Filshie clip was placed on the right fallopian tube about 2-3 cm from the cornual attachment, with care given to incorporate the underlying mesosalpinx.  A similar process was carried out on the contralateral side allowing for bilateral tubal sterilization.   Good hemostasis was noted overall.  The instruments were then removed from the patient's abdomen and the fascial incision was repaired with 0 Vicryl, and the skin was closed with 3-0 vicryl and Dermabond.  The uterine manipulator and the tenaculum were removed from the vagina without complications. The patient tolerated the procedure well.  Sponge, lap, and needle counts were correct times two.  The patient was then taken to the recovery room awake, extubated and in stable condition.  Lilana Blasko L. Harraway-Smith, M.D.,  FACOG

## 2017-03-24 NOTE — Brief Op Note (Signed)
03/24/2017  1:19 PM  PATIENT:  Michelle Fox  31 y.o. female  PRE-OPERATIVE DIAGNOSIS:  Undesired Fertility  POST-OPERATIVE DIAGNOSIS:  Undesired Fertility  PROCEDURE:  Procedure(s): LAPAROSCOPIC TUBAL LIGATION Filshie Clips (Bilateral)  SURGEON:  Surgeon(s) and Role:    * Willodean Rosenthal, MD - Primary  ANESTHESIA:   general  EBL:  Total I/O In: -  Out: 30 [Urine:15; Blood:15]  BLOOD ADMINISTERED:none  DRAINS: none   LOCAL MEDICATIONS USED:  MARCAINE     SPECIMEN:  No Specimen  DISPOSITION OF SPECIMEN:  N/A  COUNTS:  YES  TOURNIQUET:  * No tourniquets in log *  DICTATION: .Note written in EPIC  PLAN OF CARE: Discharge to home after PACU  PATIENT DISPOSITION:  PACU - hemodynamically stable.   Delay start of Pharmacological VTE agent (>24hrs) due to surgical blood loss or risk of bleeding: not applicable  Complications: none imediate   Jabrea Kallstrom L. Harraway-Smith, M.D., [facility]

## 2017-03-24 NOTE — Anesthesia Procedure Notes (Signed)
Procedure Name: Intubation Date/Time: 03/24/2017 12:10 PM Performed by: Casanova Schurman G Pre-anesthesia Checklist: Patient identified, Emergency Drugs available, Suction available, Patient being monitored and Timeout performed Patient Re-evaluated:Patient Re-evaluated prior to induction Oxygen Delivery Method: Circle system utilized Preoxygenation: Pre-oxygenation with 100% oxygen Induction Type: IV induction Ventilation: Mask ventilation without difficulty Laryngoscope Size: Mac and 3 Grade View: Grade I Tube type: Oral Number of attempts: 1 Airway Equipment and Method: Stylet Placement Confirmation: ETT inserted through vocal cords under direct vision,  positive ETCO2 and breath sounds checked- equal and bilateral Secured at: 21 cm Tube secured with: Tape Dental Injury: Teeth and Oropharynx as per pre-operative assessment

## 2017-03-24 NOTE — Anesthesia Postprocedure Evaluation (Signed)
Anesthesia Post Note  Patient: Michelle Fox  Procedure(s) Performed: LAPAROSCOPIC TUBAL LIGATION Filshie Clips (Bilateral )     Patient location during evaluation: PACU Anesthesia Type: General Level of consciousness: awake and alert Pain management: pain level controlled Vital Signs Assessment: post-procedure vital signs reviewed and stable Respiratory status: spontaneous breathing, nonlabored ventilation and respiratory function stable Cardiovascular status: blood pressure returned to baseline and stable Postop Assessment: no apparent nausea or vomiting Anesthetic complications: no    Last Vitals:  Vitals:   03/24/17 1315 03/24/17 1330  BP: (!) 131/99 (!) 135/98  Pulse: 73 74  Resp: 14 16  Temp:  37.1 C  SpO2: 100% 99%    Last Pain:  Vitals:   03/24/17 1330  TempSrc:   PainSc: 6    Pain Goal: Patients Stated Pain Goal: 3 (03/24/17 1330)               Cecile Hearing

## 2017-03-24 NOTE — Anesthesia Preprocedure Evaluation (Addendum)
Anesthesia Evaluation  Patient identified by MRN, date of birth, ID band Patient awake    Reviewed: Allergy & Precautions, NPO status , Patient's Chart, lab work & pertinent test results  Airway Mallampati: I  TM Distance: >3 FB Neck ROM: Full    Dental  (+) Teeth Intact, Dental Advisory Given   Pulmonary neg pulmonary ROS,    Pulmonary exam normal breath sounds clear to auscultation       Cardiovascular negative cardio ROS Normal cardiovascular exam Rhythm:Regular Rate:Normal     Neuro/Psych negative neurological ROS  negative psych ROS   GI/Hepatic negative GI ROS, Neg liver ROS,   Endo/Other  negative endocrine ROS  Renal/GU negative Renal ROS     Musculoskeletal negative musculoskeletal ROS (+)   Abdominal   Peds  Hematology negative hematology ROS (+)   Anesthesia Other Findings Day of surgery medications reviewed with the patient.  Reproductive/Obstetrics Depo-provera                             Anesthesia Physical Anesthesia Plan  ASA: II  Anesthesia Plan: General   Post-op Pain Management:    Induction: Intravenous  PONV Risk Score and Plan: 3 and Ondansetron, Dexamethasone, Midazolam, Scopolamine patch - Pre-op and Treatment may vary due to age or medical condition  Airway Management Planned: Oral ETT  Additional Equipment:   Intra-op Plan:   Post-operative Plan: Extubation in OR  Informed Consent: I have reviewed the patients History and Physical, chart, labs and discussed the procedure including the risks, benefits and alternatives for the proposed anesthesia with the patient or authorized representative who has indicated his/her understanding and acceptance.   Dental advisory given  Plan Discussed with: CRNA  Anesthesia Plan Comments: (Risks/benefits of general anesthesia discussed with patient including risk of damage to teeth, lips, gum, and tongue,  nausea/vomiting, allergic reactions to medications, and the possibility of heart attack, stroke and death.  All patient questions answered.  Patient wishes to proceed.)        Anesthesia Quick Evaluation

## 2017-03-25 ENCOUNTER — Encounter (HOSPITAL_COMMUNITY): Payer: Self-pay | Admitting: Obstetrics & Gynecology

## 2017-04-09 ENCOUNTER — Encounter: Payer: Medicaid Other | Admitting: Obstetrics & Gynecology

## 2018-04-19 ENCOUNTER — Ambulatory Visit: Payer: Medicaid Other | Admitting: Obstetrics & Gynecology

## 2018-05-11 ENCOUNTER — Ambulatory Visit: Payer: Medicaid Other | Admitting: Advanced Practice Midwife

## 2018-10-02 IMAGING — US US MFM OB COMP +14 WKS
1 series · 14 of 28 positions shown · non-contrast
Comparison: none

[Series 1: us mfm ob comp +14 wks · 56 acquisitions, 14 frames shown]
[im 3/56]
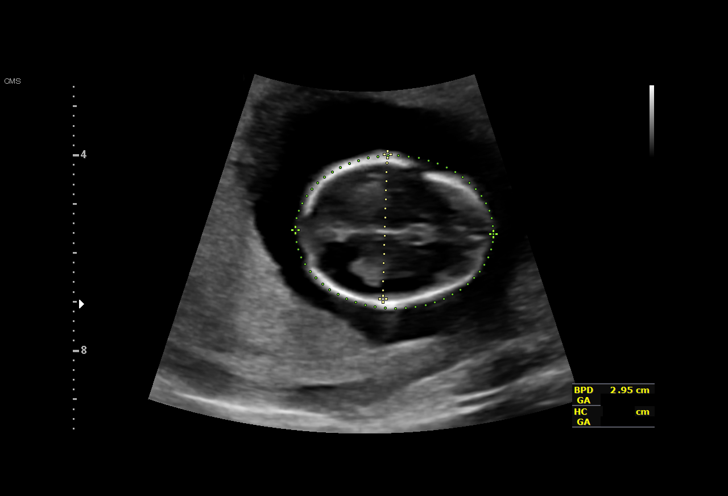
[im 7/56]
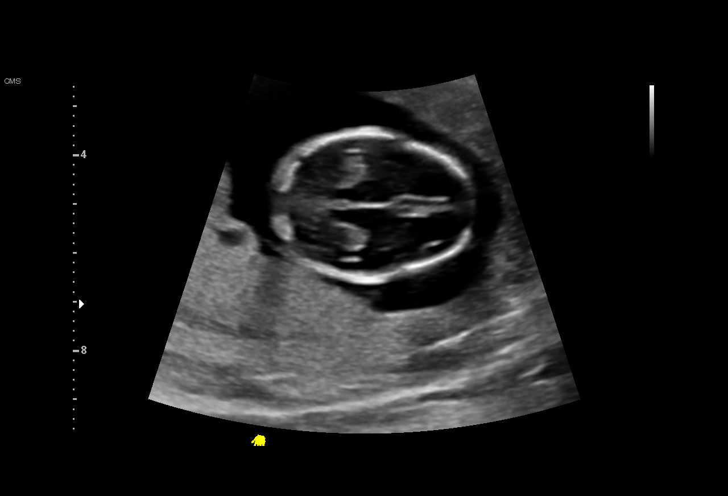
[im 11/56]
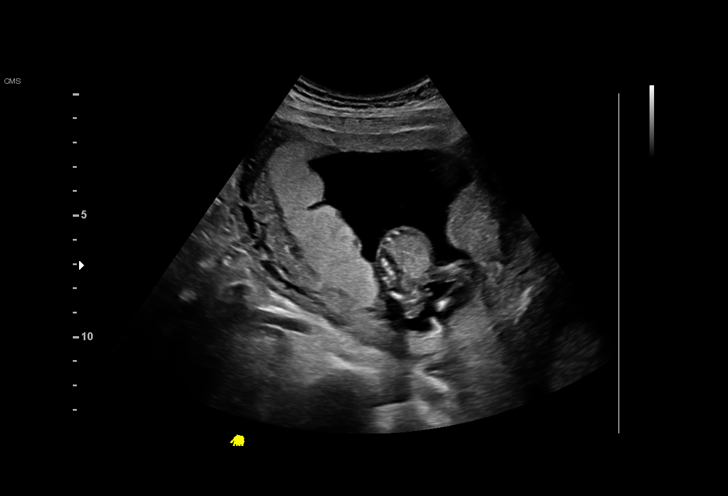
[im 15/56]
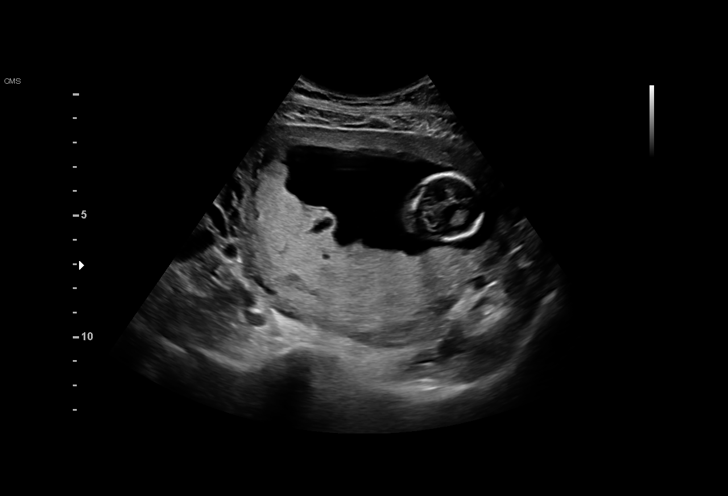
[im 19/56]
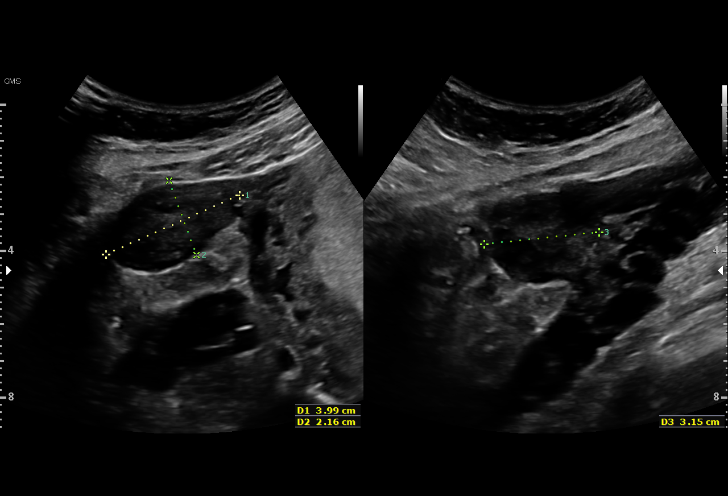
[im 23/56]
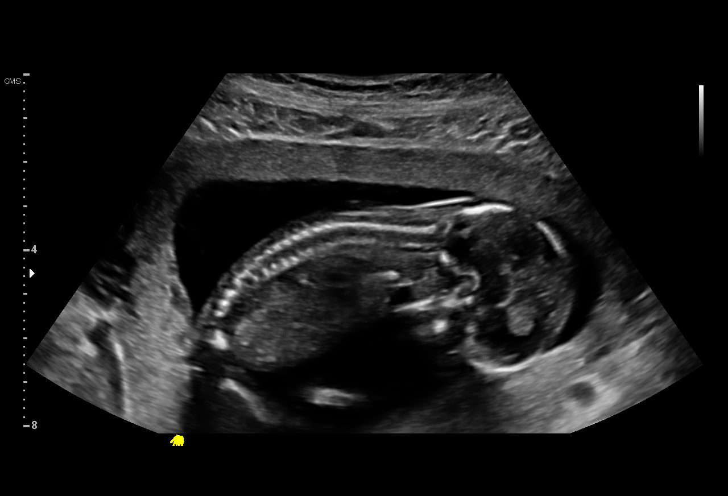
[im 27/56]
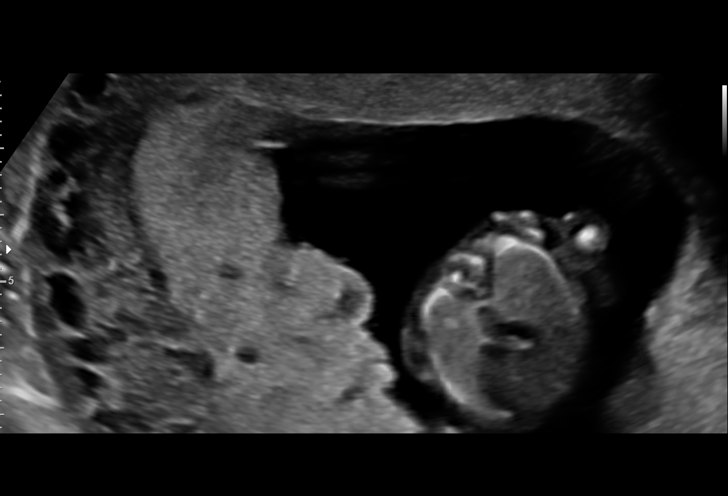
[im 31/56]
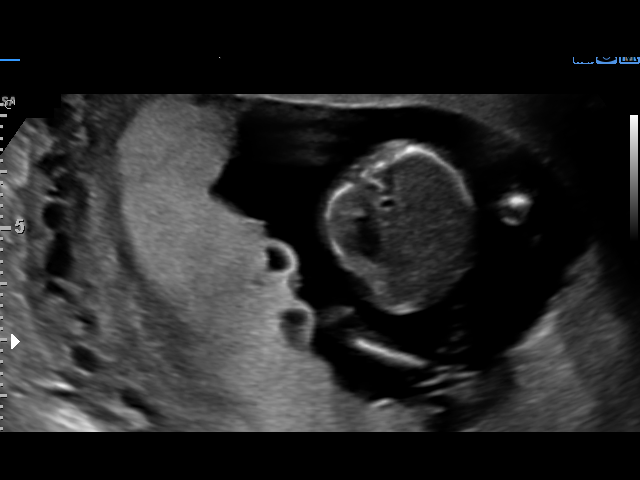
[im 35/56]
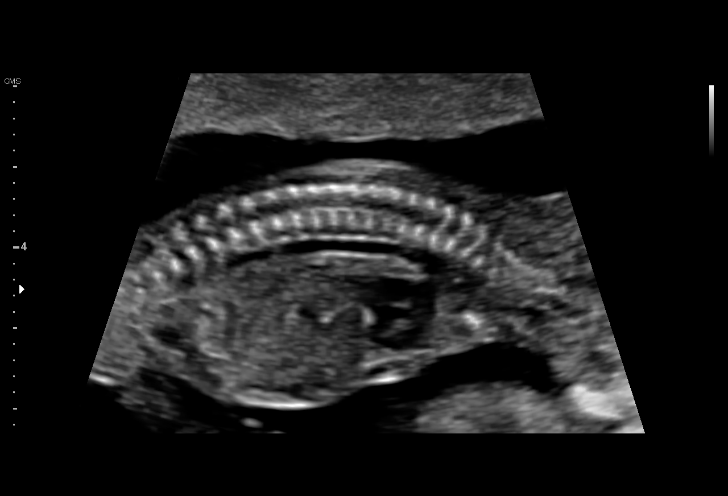
[im 39/56]
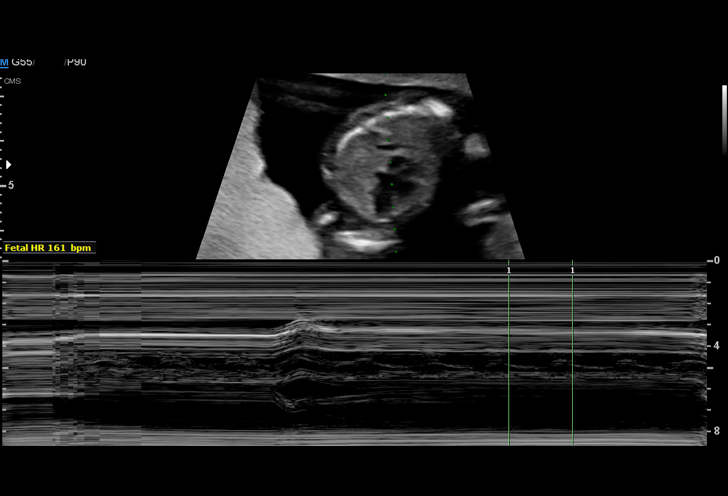
[im 43/56]
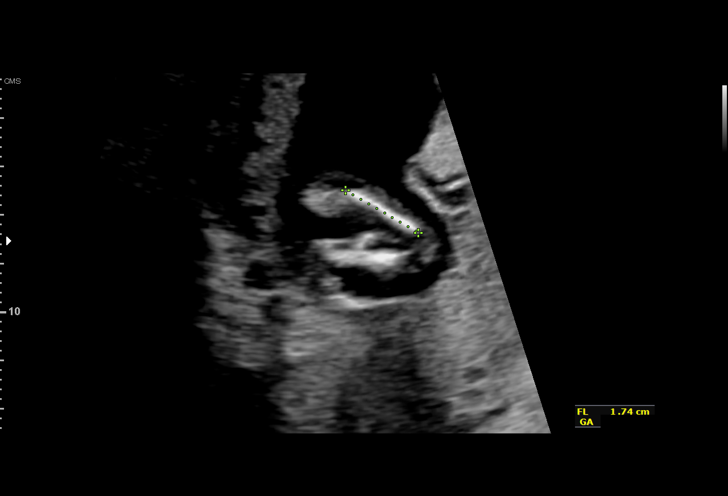
[im 47/56]
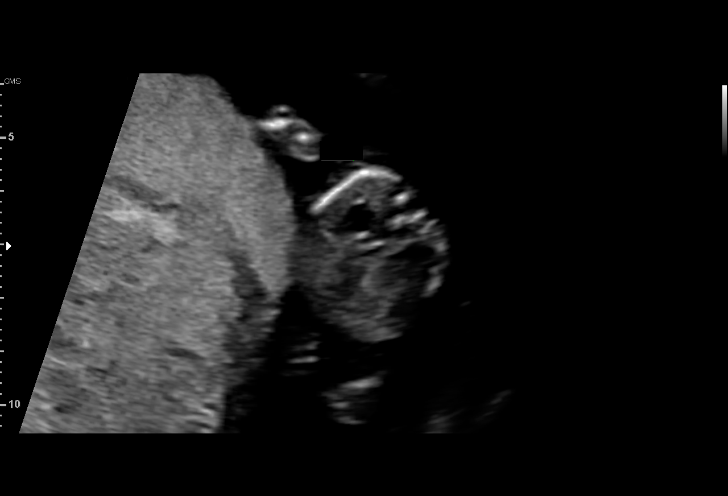
[im 51/56]
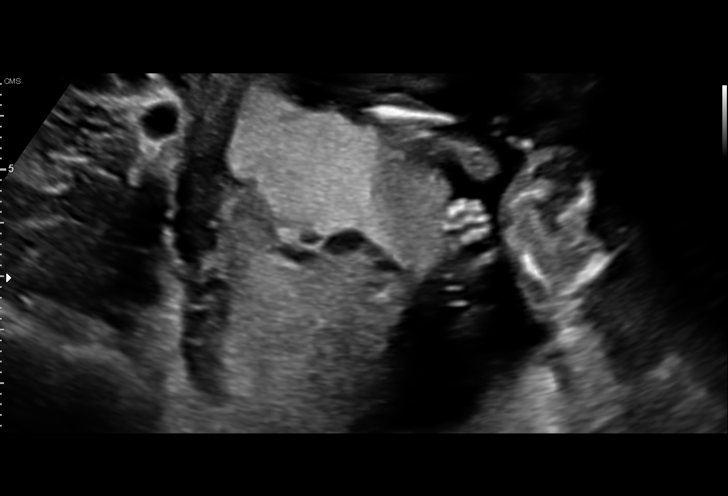
[im 56/56]
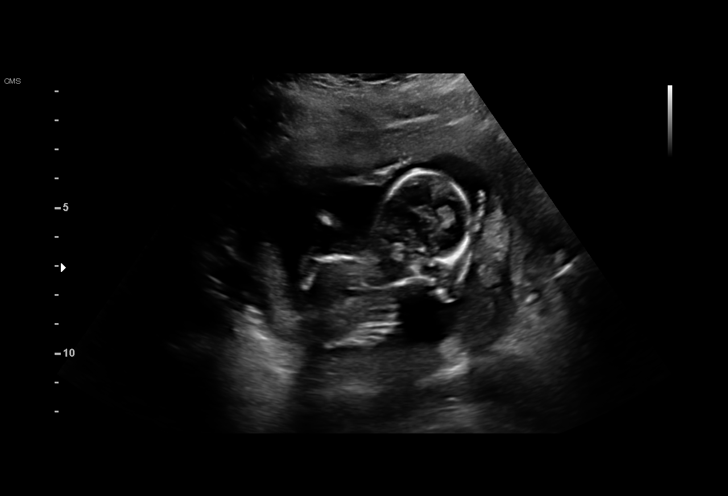

[14 of 28 positions shown; findings below may reference images not displayed]

for [REDACTED]care ([HOSPITAL])

1  RAE MURO           [id number]      [id number]     [id number]
Indications

15 weeks gestation of pregnancy
Encounter for antenatal screening for
malformations
Encounter for uncertain dates
OB History

Gravidity:    5         Term:   2        Prem:   0        SAB:   0
TOP:          2       Ectopic:  0        Living: 2
Fetal Evaluation

Num Of Fetuses:     1
Fetal Heart         161
Rate(bpm):
Cardiac Activity:   Observed
Presentation:       Transverse, head to maternal left
Placenta:           Posterior
P. Cord Insertion:  Visualized, central

Amniotic Fluid
AFI FV:      Subjectively within normal limits

Largest Pocket(cm)
4.78
Biometry
BPD:      29.7  mm     G. Age:  15w 3d         36  %    CI:        66.36   %    70 - 86
FL/HC:      14.9   %    13.3 -
HC:      116.9  mm     G. Age:  15w 6d         44  %    HC/AC:      1.17        1.05 -
AC:      100.2  mm     G. Age:  16w 0d         69  %    FL/BPD:     58.6   %
FL:       17.4  mm     G. Age:  15w 1d         28  %    FL/AC:      17.4   %    20 - 24
HUM:      18.7  mm     G. Age:  15w 3d         50  %
CER:      14.5  mm     G. Age:  14w 4d         26  %

Est. FW:     130  gm      0 lb 5 oz     73  %
Gestational Age

LMP:           18w 6d        Date:  12/05/15                 EDD:   09/10/16
U/S Today:     15w 4d                                        EDD:   10/03/16
Best:          15w 4d     Det. By:  U/S (04/15/16)           EDD:   10/03/16
Anatomy

Cranium:               Appears normal         Aortic Arch:            Not well visualized
Cavum:                 Appears normal         Ductal Arch:            Appears normal
Ventricles:            Appears normal         Diaphragm:              Not well visualized
Choroid Plexus:        Appears normal         Stomach:                Appears normal, left
sided
Cerebellum:            Appears normal         Abdomen:                Appears normal
Posterior Fossa:       Appears normal         Abdominal Wall:         Not well visualized
Nuchal Fold:           Not applicable (< 16   Cord Vessels:           Not well visualized
wks GA)
Face:                  Orbits appear          Kidneys:                Appear normal
normal
Lips:                  Not well visualized    Bladder:                Appears normal
Thoracic:              Appears normal         Spine:                  Appears normal
Heart:                 Not well visualized    Upper Extremities:      Appears normal
RVOT:                  Not well visualized    Lower Extremities:      Appears normal
LVOT:                  Appears normal

Other:  Fetus appears to be a male. 5th digit visualized. Technically difficult
due to early gestational age and fetal position.
Cervix Uterus Adnexa

Cervix
Length:            3.9  cm.
Normal appearance by transabdominal scan.

Uterus
No abnormality visualized.

Left Ovary
Not visualized.

Right Ovary
Within normal limits.

Cul De Sac:   No free fluid seen.

Adnexa:       No abnormality visualized.
Impression

Single IUP at 15w 4d
Limited views of the fetal anatomy obtained due to early
gestational age
No gross anomalies noted
Posterior placenta without previa
Normal amniotic fluid volume
Recommendations

Recommend follow-up ultrasound examination in 4 weeks to
complete anatomy

## 2018-10-14 ENCOUNTER — Telehealth: Payer: Self-pay | Admitting: *Deleted

## 2018-10-14 NOTE — Telephone Encounter (Signed)
Patient states that she thinks that she has a yeast infection and wanted to schedule her annual. Patient was told to try OTC med first and that the office will call to schedule her annual when we can due to COVID-19. If yeast infection doesn't resolve after completing medication, she can call back. Patient stated that she tried a new body wash that she thinks is causing irration and discharge. Patient agreed to advice.

## 2018-12-14 ENCOUNTER — Ambulatory Visit (INDEPENDENT_AMBULATORY_CARE_PROVIDER_SITE_OTHER): Payer: 59 | Admitting: Certified Nurse Midwife

## 2018-12-14 ENCOUNTER — Encounter: Payer: Self-pay | Admitting: Certified Nurse Midwife

## 2018-12-14 ENCOUNTER — Other Ambulatory Visit (HOSPITAL_COMMUNITY)
Admission: RE | Admit: 2018-12-14 | Discharge: 2018-12-14 | Disposition: A | Discharge status: Home / Self Care | Payer: 59 | Source: Ambulatory Visit | Attending: Certified Nurse Midwife | Admitting: Certified Nurse Midwife

## 2018-12-14 ENCOUNTER — Other Ambulatory Visit: Payer: Self-pay

## 2018-12-14 VITALS — BP 124/81 | HR 67 | Ht 67.0 in | Wt 199.0 lb

## 2018-12-14 DIAGNOSIS — B9689 Other specified bacterial agents as the cause of diseases classified elsewhere: Secondary | ICD-10-CM

## 2018-12-14 DIAGNOSIS — Z01419 Encounter for gynecological examination (general) (routine) without abnormal findings: Secondary | ICD-10-CM | POA: Diagnosis not present

## 2018-12-14 MED ORDER — FLUCONAZOLE 150 MG PO TABS: 150.0000 mg | ORAL_TABLET | Freq: Every day | ORAL | 1 refills | Status: DC

## 2018-12-14 MED ORDER — TINIDAZOLE 500 MG PO TABS: 2.0000 g | ORAL_TABLET | Freq: Every day | ORAL | 0 refills | Status: DC

## 2018-12-14 NOTE — Patient Instructions (Signed)

## 2018-12-14 NOTE — Progress Notes (Signed)
GYNECOLOGY ANNUAL PREVENTATIVE CARE ENCOUNTER NOTE  History:     Michelle Fox is a 33 y.o. [phone] female here for a routine annual gynecologic exam.  Current complaints: vaginal irritation, she reports having a yeast infection a couple of weeks ago- took Monistat and cleared the irritation. Denies abnormal vaginal bleeding, discharge, pelvic pain, problems with intercourse or other gynecologic concerns.    Gynecologic History Patient's last menstrual period was 11/20/2018. Contraception: tubal ligation Last Pap: 02/2016. Results were: normal with negative HPV  Obstetric History OB History  Gravida Para Term Preterm AB Living  5 2 2   2 2   SAB TAB Ectopic Multiple Live Births    2     2    # Outcome Date GA Lbr Len/2nd Weight Sex Delivery Anes PTL Lv  5 Gravida           4 Term 07/26/07 [redacted]w[redacted]d  9 lb (4.082 kg) M Vag-Spont   LIV     Birth Comments: System Generated. Please review and update pregnancy details.  3 TAB 06/10/03     TAB   LIV  2 Term 06/09/02 [redacted]w[redacted]d  7 lb 9.6 oz (3.447 kg)  Vag-Spont   LIV  1 TAB      TAB       Past Medical History:  Diagnosis Date  . Medical history non-contributory     Past Surgical History:  Procedure Laterality Date  . LAPAROSCOPIC TUBAL LIGATION Bilateral 03/24/2017   Procedure: LAPAROSCOPIC TUBAL LIGATION Filshie Clips;  Surgeon: Lavonia Drafts, MD;  Location: [facility];  Service: Gynecology;  Laterality: Bilateral;  . NO PAST SURGERIES    . WISDOM TOOTH EXTRACTION  2017    Current Outpatient Medications on File Prior to Visit  Medication Sig Dispense Refill  . cetirizine-pseudoephedrine (ZYRTEC-D) 5-120 MG tablet Take 1 tablet by mouth 2 (two) times daily. 20 tablet 0  . cycloSPORINE (RESTASIS) 0.05 % ophthalmic emulsion Place 1 drop into both eyes daily as needed (irritation).    Marland Kitchen ibuprofen (ADVIL,MOTRIN) 800 MG tablet Take 1 tablet (800 mg total) by mouth daily as needed for headache. (Patient not taking: Reported on 12/14/2018)  20 tablet 0  . prednisoLONE acetate (PRED FORTE) 1 % ophthalmic suspension Place 1 drop into both eyes daily as needed (allergies).     No current facility-administered medications on file prior to visit.     No Known Allergies  Social History:  reports that she has never smoked. She has never used smokeless tobacco. She reports current alcohol use. She reports that she does not use drugs.  Family History  Problem Relation Age of Onset  . Diabetes Maternal Grandmother   . Diabetes Maternal Grandfather   . Diabetes Paternal Grandmother   . Diabetes Paternal Grandfather     The following portions of the patient's history were reviewed and updated as appropriate: allergies, current medications, past family history, past medical history, past social history, past surgical history and problem list.  Review of Systems Pertinent items noted in HPI and remainder of comprehensive ROS otherwise negative.  Physical Exam:  BP 124/81   Pulse 67   Ht 5\' 7"  (1.702 m)   Wt 199 lb (90.3 kg)   LMP 11/20/2018   Breastfeeding No   BMI 31.17 kg/m  CONSTITUTIONAL: Well-developed, well-nourished female in no acute distress.  HENT:  Normocephalic, atraumatic, External right and left ear normal. Oropharynx is clear and moist EYES: Conjunctivae and EOM are normal. Pupils are equal, round, and  reactive to light. NECK: Normal range of motion, supple, no masses.  Normal thyroid.  SKIN: Skin is warm and dry. No rash noted. Not diaphoretic. No erythema. No pallor. MUSCULOSKELETAL: Normal range of motion. No tenderness.  No cyanosis, clubbing, or edema.  2+ distal pulses. NEUROLOGIC: Alert and oriented to person, place, and time. Normal reflexes, muscle tone coordination. No cranial nerve deficit noted. PSYCHIATRIC: Normal mood and affect. Normal behavior. Normal judgment and thought content. CARDIOVASCULAR: Normal heart rate noted, regular rhythm RESPIRATORY: Clear to auscultation bilaterally. Effort and  breath sounds normal, no problems with respiration noted. BREASTS: Symmetric in size. No masses, skin changes, nipple drainage, or lymphadenopathy. ABDOMEN: Soft, normal bowel sounds, no distention noted.  No tenderness, rebound or guarding.  PELVIC: Normal appearing external genitalia; normal appearing vaginal mucosa and cervix.  Large amount of thin white discharge present.  Pap smear obtained and cervicovaginal ancillary swab obtained.  Normal uterine size, no other palpable masses, no uterine or adnexal tenderness.   Assessment and Plan:    1. Women's annual routine gynecological examination - Normal well women visit, pap would be due in Sept 2020- obtained today  - Cytology - PAP( [facility])  2. Bacterial vaginosis - Prophylactic treatment while swabs pending  - Patient request yeast treatment d/t antibiotic treatment  - tinidazole (TINDAMAX) 500 MG tablet; Take 4 tablets (2,000 mg total) by mouth daily with breakfast. For two days  Dispense: 8 tablet; Refill: 0 - fluconazole (DIFLUCAN) 150 MG tablet; Take 1 tablet (150 mg total) by mouth daily.  Dispense: 1 tablet; Refill: 1 - Cervicovaginal ancillary only( [facility])  Will follow up results of pap smear and manage accordingly. Routine preventative health maintenance measures emphasized. Please refer to After Visit Summary for other counseling recommendations.      Sharyon CableVeronica C Tenya Araque, CNM [facility]

## 2018-12-15 LAB — CYTOLOGY - PAP
Adequacy: ABSENT
Chlamydia: NEGATIVE
Diagnosis: NEGATIVE
HPV: DETECTED — AB
Neisseria Gonorrhea: NEGATIVE

## 2018-12-15 LAB — CERVICOVAGINAL ANCILLARY ONLY
Bacterial vaginitis: POSITIVE — AB
Candida vaginitis: NEGATIVE

## 2018-12-16 ENCOUNTER — Telehealth: Payer: Self-pay

## 2018-12-16 ENCOUNTER — Other Ambulatory Visit: Payer: Self-pay | Admitting: Certified Nurse Midwife

## 2018-12-16 NOTE — Telephone Encounter (Signed)
Spoke with pt and she is aware of positive Trich and BV results. Pt is aware that Tindamax was sent by Verdene Lennert for treatment.

## 2020-04-24 ENCOUNTER — Ambulatory Visit: Payer: No Typology Code available for payment source | Admitting: Certified Nurse Midwife

## 2020-05-11 ENCOUNTER — Ambulatory Visit (INDEPENDENT_AMBULATORY_CARE_PROVIDER_SITE_OTHER): Payer: No Typology Code available for payment source | Admitting: Certified Nurse Midwife

## 2020-05-11 ENCOUNTER — Other Ambulatory Visit: Payer: Self-pay

## 2020-05-11 ENCOUNTER — Encounter: Payer: Self-pay | Admitting: Certified Nurse Midwife

## 2020-05-11 ENCOUNTER — Other Ambulatory Visit (HOSPITAL_COMMUNITY)
Admission: RE | Admit: 2020-05-11 | Discharge: 2020-05-11 | Disposition: A | Discharge status: Home / Self Care | Payer: No Typology Code available for payment source | Source: Ambulatory Visit | Attending: Certified Nurse Midwife | Admitting: Certified Nurse Midwife

## 2020-05-11 VITALS — BP 122/74 | HR 95 | Resp 16 | Ht 67.0 in | Wt 195.0 lb

## 2020-05-11 DIAGNOSIS — Z01419 Encounter for gynecological examination (general) (routine) without abnormal findings: Secondary | ICD-10-CM | POA: Insufficient documentation

## 2020-05-11 NOTE — Progress Notes (Signed)
Gynecology Annual Exam   History of Present Illness: Michelle Fox is a 34 y.o. engaged female presenting for an annual exam. She has no complaints today. She is not sexually active, her fiance is incarcerated. She does perform self breast exams. There is no notable family history of breast or ovarian cancer in her family. She requests STD testing.  Past Medical History:  Past Medical History:  Diagnosis Date  . Medical history non-contributory     Past Surgical History:  Past Surgical History:  Procedure Laterality Date  . LAPAROSCOPIC TUBAL LIGATION Bilateral 03/24/2017   Procedure: LAPAROSCOPIC TUBAL LIGATION Filshie Clips;  Surgeon: Willodean Rosenthal, MD;  Location: [facility];  Service: Gynecology;  Laterality: Bilateral;  . NO PAST SURGERIES    . WISDOM TOOTH EXTRACTION  2017    Gynecologic History:  LMP: Patient's last menstrual period was 04/18/2020. Average Interval: regular Heavy Menses: no Clots: no Intermenstrual Bleeding: no Postcoital Bleeding: not applicable Dysmenorrhea: not applicable Contraception: tubal ligation Last Pap: completed on 12/14/18 ; result was: NIL and HR HPV+  Mammogram: n/a  Obstetric History: [id number]  Family History:  Family History  Problem Relation Age of Onset  . Diabetes Maternal Grandmother   . Diabetes Maternal Grandfather   . Diabetes Paternal Grandmother   . Diabetes Paternal Grandfather     Social History:  Social History   Socioeconomic History  . Marital status: Single    Spouse name: Not on file  . Number of children: Not on file  . Years of education: Not on file  . Highest education level: Not on file  Occupational History  . Not on file  Tobacco Use  . Smoking status: Never Smoker  . Smokeless tobacco: Never Used  Vaping Use  . Vaping Use: Never used  Substance and Sexual Activity  . Alcohol use: Yes    Comment: occas  . Drug use: No  . Sexual activity: Yes    Birth control/protection: Surgical  Other  Topics Concern  . Not on file  Social History Narrative  . Not on file   Social Determinants of Health   Financial Resource Strain:   . Difficulty of Paying Living Expenses: Not on file  Food Insecurity:   . Worried About Programme researcher, broadcasting/film/video in the Last Year: Not on file  . Ran Out of Food in the Last Year: Not on file  Transportation Needs:   . Lack of Transportation (Medical): Not on file  . Lack of Transportation (Non-Medical): Not on file  Physical Activity:   . Days of Exercise per Week: Not on file  . Minutes of Exercise per Session: Not on file  Stress:   . Feeling of Stress : Not on file  Social Connections:   . Frequency of Communication with Friends and Family: Not on file  . Frequency of Social Gatherings with Friends and Family: Not on file  . Attends Religious Services: Not on file  . Active Member of Clubs or Organizations: Not on file  . Attends Banker Meetings: Not on file  . Marital Status: Not on file  Intimate Partner Violence:   . Fear of Current or Ex-Partner: Not on file  . Emotionally Abused: Not on file  . Physically Abused: Not on file  . Sexually Abused: Not on file    Allergies:  No Known Allergies  Medications: Prior to Admission medications   Medication Sig Start Date End Date Taking? Authorizing Provider  cetirizine-pseudoephedrine (ZYRTEC-D) 5-120 MG tablet Take  1 tablet by mouth 2 (two) times daily. 08/05/16  Yes Lesly Dukes, MD  cycloSPORINE (RESTASIS) 0.05 % ophthalmic emulsion Place 1 drop into both eyes daily as needed (irritation). Patient not taking: Reported on 05/11/2020    [provider]  ibuprofen (ADVIL,MOTRIN) 800 MG tablet Take 1 tablet (800 mg total) by mouth daily as needed for headache. Patient not taking: Reported on 12/14/2018 03/24/17   Willodean Rosenthal, MD  prednisoLONE acetate (PRED FORTE) 1 % ophthalmic suspension Place 1 drop into both eyes daily as needed (allergies). Patient not  taking: Reported on 05/11/2020    [provider]    Review of Systems: negative except noted in HPI  Physical Exam Vitals: BP 122/74   Pulse 95   Resp 16   Ht 5\' 7"  (1.702 m)   Wt 195 lb (88.5 kg)   LMP 04/18/2020   Breastfeeding No   BMI 30.54 kg/m  General: NAD HEENT: normocephalic, atraumatic Thyroid: no enlargement, no palpable nodules Pulmonary: Normal rate and effort, CTAB Cardiovascular: RRR Breast: Breast symmetrical, no tenderness, no palpable nodules or masses, no skin or nipple retraction present, no nipple discharge. No axillary or supraclavicular lymphadenopathy. Abdomen: soft, non-tender, non-distended. No hepatomegaly, splenomegaly or masses palpable. No evidence of hernia  Genitourinary:  External: Normal external female genitalia. Normal urethral meatus  Vagina: Normal vaginal mucosa, no evidence of prolapse   Cervix: Grossly normal in appearance, no bleeding  Uterus: Non-enlarged, mobile, normal contour. No CMT  Adnexa: non-enlarged, no masses  Rectal: deferred Extremities: no edema, erythema, or tenderness Neurologic: Grossly intact Psychiatric: mood appropriate, affect full  Female chaperone present for pelvic and breast portions of the physical exam  Assessment:  1. Well woman exam with routine gynecological exam    Plan: Papsmear today Follow up with GYN in 1 year or prn Follow up with PCP as scheduled  13/03/2020, CNM 05/11/2020 11:18 AM

## 2020-05-11 NOTE — Progress Notes (Signed)
Pt request GC/Chlamydia with pap

## 2020-05-14 LAB — CYTOLOGY - PAP
Chlamydia: NEGATIVE
Comment: NEGATIVE
Comment: NEGATIVE
Comment: NORMAL
Diagnosis: NEGATIVE
High risk HPV: NEGATIVE
Neisseria Gonorrhea: NEGATIVE

## 2021-05-24 ENCOUNTER — Ambulatory Visit (INDEPENDENT_AMBULATORY_CARE_PROVIDER_SITE_OTHER): Payer: No Typology Code available for payment source | Admitting: Certified Nurse Midwife

## 2021-05-24 ENCOUNTER — Encounter: Payer: Self-pay | Admitting: Certified Nurse Midwife

## 2021-05-24 ENCOUNTER — Other Ambulatory Visit: Payer: Self-pay

## 2021-05-24 VITALS — BP 121/79 | HR 85 | Resp 16 | Ht 67.0 in | Wt 210.0 lb

## 2021-05-24 DIAGNOSIS — N946 Dysmenorrhea, unspecified: Secondary | ICD-10-CM

## 2021-05-24 DIAGNOSIS — Z01419 Encounter for gynecological examination (general) (routine) without abnormal findings: Secondary | ICD-10-CM

## 2021-05-24 DIAGNOSIS — N393 Stress incontinence (female) (male): Secondary | ICD-10-CM

## 2021-05-24 DIAGNOSIS — N920 Excessive and frequent menstruation with regular cycle: Secondary | ICD-10-CM

## 2021-05-24 MED ORDER — TRANEXAMIC ACID 650 MG PO TABS: 1300.0000 mg | ORAL_TABLET | Freq: Three times a day (TID) | ORAL | 1 refills | Status: AC

## 2021-05-24 NOTE — Progress Notes (Signed)
Gynecology Annual Exam   History of Present Illness: Michelle Fox is a 35 y.o. single female presenting for an annual exam. She has no complaints today. She is sexually active. She denies dyspareunia. She does perform self breast exams. There is no notable family history of breast or ovarian cancer in her family. Reports heavy periods since she the BTL in 2018. Has to use overnight pads. Endorses associated pain and cramping. Also reports urinary incontinence with cough, sneeze,etc. Started 1-2 months ago. Denies other urinary sx. Declines STD screening.  Past Medical History:  Past Medical History:  Diagnosis Date   Medical history non-contributory     Past Surgical History:  Past Surgical History:  Procedure Laterality Date   LAPAROSCOPIC TUBAL LIGATION Bilateral 03/24/2017   Procedure: LAPAROSCOPIC TUBAL LIGATION Filshie Clips;  Surgeon: Willodean Rosenthal, MD;  Location: [facility];  Service: Gynecology;  Laterality: Bilateral;   NO PAST SURGERIES     WISDOM TOOTH EXTRACTION  2017    Gynecologic History:  LMP: Patient's last menstrual period was 05/09/2021. Average Interval: regular Heavy Menses: yes Clots: no Intermenstrual Bleeding: no Postcoital Bleeding: no Dysmenorrhea: yes Contraception: tubal ligation Last Pap: completed on 05/11/20 ; result was: NIL and HR HPV negative  Mammogram: n/a  Obstetric History: [id number]  Family History:  Family History  Problem Relation Age of Onset   Diabetes Maternal Grandmother    Diabetes Maternal Grandfather    Diabetes Paternal Grandmother    Diabetes Paternal Grandfather     Social History:  Social History   Socioeconomic History   Marital status: Single    Spouse name: Not on file   Number of children: Not on file   Years of education: Not on file   Highest education level: Not on file  Occupational History   Not on file  Tobacco Use   Smoking status: Never   Smokeless tobacco: Never  Vaping Use   Vaping Use: Never  used  Substance and Sexual Activity   Alcohol use: Yes    Comment: occas   Drug use: No   Sexual activity: Yes    Birth control/protection: Surgical  Other Topics Concern   Not on file  Social History Narrative   Not on file   Social Determinants of Health   Financial Resource Strain: Not on file  Food Insecurity: Not on file  Transportation Needs: Not on file  Physical Activity: Not on file  Stress: Not on file  Social Connections: Not on file  Intimate Partner Violence: Not on file    Allergies:  No Known Allergies  Medications: Prior to Admission medications   Medication Sig Start Date End Date Taking? Authorizing Provider  cetirizine-pseudoephedrine (ZYRTEC-D) 5-120 MG tablet Take 1 tablet by mouth 2 (two) times daily. 08/05/16  Yes Lesly Dukes, MD    Review of Systems: negative except noted in HPI  Physical Exam Vitals: BP 121/79    Pulse 85    Resp 16    Ht 5\' 7"  (1.702 m)    Wt 210 lb (95.3 kg)    LMP 05/09/2021    BMI 32.89 kg/m  General: NAD HEENT: normocephalic, atraumatic Thyroid: no enlargement, no palpable nodules Pulmonary: Normal rate and effort, CTAB Cardiovascular: RRR Breast: Breast symmetrical, no tenderness, no palpable nodules or masses, no skin or nipple retraction present, no nipple discharge. No axillary or supraclavicular lymphadenopathy. Abdomen: soft, non-tender, non-distended. No hepatomegaly, splenomegaly or masses palpable. No evidence of hernia  Genitourinary: deferred Extremities: no edema, erythema, or  tenderness Neurologic: Grossly intact Psychiatric: mood appropriate, affect full Female chaperone present for breast portion of the physical exam  No results found for this or any previous visit (from the past 24 hour(s)).  Assessment:  1. Well woman exam   2. Menorrhagia with regular cycle   3. Dysmenorrhea   4. Stress incontinence of urine    Plan: Referral to pelvic PT for eval Rx Lysteda- try for a few months, if not  helping consider Mirena IUD Follow up with GYN in 1 year or prn Follow up with PCP as scheduled  Donette Larry, CNM 05/24/2021 9:33 AM

## 2021-05-25 ENCOUNTER — Encounter: Payer: Self-pay | Admitting: Certified Nurse Midwife

## 2021-12-16 DIAGNOSIS — L219 Seborrheic dermatitis, unspecified: Secondary | ICD-10-CM | POA: Insufficient documentation

## 2022-03-28 ENCOUNTER — Encounter: Payer: Self-pay | Admitting: Obstetrics and Gynecology

## 2022-03-28 ENCOUNTER — Ambulatory Visit (INDEPENDENT_AMBULATORY_CARE_PROVIDER_SITE_OTHER): Payer: 59 | Admitting: Obstetrics and Gynecology

## 2022-03-28 ENCOUNTER — Other Ambulatory Visit (HOSPITAL_COMMUNITY)
Admission: RE | Admit: 2022-03-28 | Discharge: 2022-03-28 | Disposition: A | Discharge status: Home / Self Care | Payer: 59 | Source: Ambulatory Visit | Attending: Obstetrics and Gynecology | Admitting: Obstetrics and Gynecology

## 2022-03-28 VITALS — BP 127/90 | HR 81 | Resp 16 | Ht 67.0 in | Wt 208.0 lb

## 2022-03-28 DIAGNOSIS — Z113 Encounter for screening for infections with a predominantly sexual mode of transmission: Secondary | ICD-10-CM | POA: Insufficient documentation

## 2022-03-28 DIAGNOSIS — Z01419 Encounter for gynecological examination (general) (routine) without abnormal findings: Secondary | ICD-10-CM | POA: Diagnosis present

## 2022-03-28 MED ORDER — TRANEXAMIC ACID 650 MG PO TABS: 1300.0000 mg | ORAL_TABLET | Freq: Three times a day (TID) | ORAL | 1 refills | Status: AC

## 2022-03-28 NOTE — Progress Notes (Signed)
GYNECOLOGY ANNUAL PREVENTATIVE CARE ENCOUNTER NOTE  History:     Michelle Fox is a 36 y.o. [phone] female here for a routine annual gynecologic exam.  Current complaints: heavy, painful periods.   Denies abnormal vaginal bleeding, discharge, pelvic pain, problems with intercourse or other gynecologic concerns.  Concerned about abnormal hair growth and heavy periods since her tubal ligation. Interested in talking more about PCOS.   Gynecologic History Patient's last menstrual period was 03/11/2022. Contraception: tubal ligation Last Pap: 2022. Result was normal with negative HPV   Obstetric History OB History  Gravida Para Term Preterm AB Living  5 3 3   2 3   SAB IAB Ectopic Multiple Live Births    2     3    # Outcome Date GA Lbr Len/2nd Weight Sex Delivery Anes PTL Lv  5 Term 2018     Vag-Spont   LIV  4 Term 07/26/07 [redacted]w[redacted]d  9 lb (4.082 kg) M Vag-Spont   LIV     Birth Comments: System Generated. Please review and update pregnancy details.  3 IAB 06/10/03     TAB   LIV  2 Term 06/09/02 [redacted]w[redacted]d  7 lb 9.6 oz (3.447 kg)  Vag-Spont   LIV  1 IAB      TAB       Past Medical History:  Diagnosis Date   Medical history non-contributory     Past Surgical History:  Procedure Laterality Date   LAPAROSCOPIC TUBAL LIGATION Bilateral 03/24/2017   Procedure: LAPAROSCOPIC TUBAL LIGATION Filshie Clips;  Surgeon: Lavonia Drafts, MD;  Location: [facility];  Service: Gynecology;  Laterality: Bilateral;   NO PAST SURGERIES     WISDOM TOOTH EXTRACTION  2017    Current Outpatient Medications on File Prior to Visit  Medication Sig Dispense Refill   cetirizine-pseudoephedrine (ZYRTEC-D) 5-120 MG tablet Take 1 tablet by mouth 2 (two) times daily. 20 tablet 0   No current facility-administered medications on file prior to visit.    No Known Allergies  Social History:  reports that she has never smoked. She has never used smokeless tobacco. She reports current alcohol use. She reports  that she does not use drugs.  Family History  Problem Relation Age of Onset   Diabetes Maternal Grandmother    Diabetes Maternal Grandfather    Diabetes Paternal Grandmother    Diabetes Paternal Grandfather     The following portions of the patient's history were reviewed and updated as appropriate: allergies, current medications, past family history, past medical history, past social history, past surgical history and problem list.  Review of Systems Pertinent items noted in HPI and remainder of comprehensive ROS otherwise negative.  Physical Exam:  BP (!) 127/90   Pulse 81   Resp 16   Ht 5\' 7"  (1.702 m)   Wt 208 lb (94.3 kg)   LMP 03/11/2022   BMI 32.58 kg/m  CONSTITUTIONAL: Well-developed, well-nourished female in no acute distress.  HENT:  Normocephalic, atraumatic, External right and left ear normal.  EYES: Conjunctivae and EOM are normal. Pupils are equal, round, and reactive to light. No scleral icterus.  NECK: Normal range of motion, supple, no masses.  Normal thyroid.  SKIN: Skin is warm and dry. No rash noted. Not diaphoretic. No erythema. No pallor. MUSCULOSKELETAL: Normal range of motion. No tenderness.  No cyanosis, clubbing, or edema. NEUROLOGIC: Alert and oriented to person, place, and time. Normal reflexes, muscle tone coordination.  PSYCHIATRIC: Normal mood and affect. Normal behavior. Normal  judgment and thought content. CARDIOVASCULAR: Normal heart rate noted, regular rhythm RESPIRATORY: Clear to auscultation bilaterally. Effort and breath sounds normal, no problems with respiration noted. BREASTS: Symmetric in size. No masses, tenderness, skin changes, nipple drainage, or lymphadenopathy bilaterally. Performed in the presence of a chaperone. ABDOMEN: Soft, no distention noted.  No tenderness, rebound or guarding.  PELVIC: Normal appearing external genitalia and urethral meatus; normal appearing vaginal mucosa and cervix.  No abnormal vaginal discharge noted.   Pap smear obtained.  Normal uterine size, no other palpable masses, no uterine or adnexal tenderness.  Performed in the presence of a chaperone.   Assessment and Plan:    1. Well woman exam  - Cytology - PAP( [facility]) - Requests refill on Lysteda   2. Routine screening for STI (sexually transmitted infection)  - Cytology - PAP( [facility])     Will follow up results of pap smear and manage accordingly.  Kaleesi Guyton, Harolyn Rutherford, NP [facility] [employer], [facility]

## 2022-03-31 LAB — CYTOLOGY - PAP
Adequacy: ABSENT
Chlamydia: NEGATIVE
Comment: NEGATIVE
Comment: NEGATIVE
Comment: NEGATIVE
Comment: NORMAL
Diagnosis: NEGATIVE
High risk HPV: NEGATIVE
Neisseria Gonorrhea: NEGATIVE
Trichomonas: NEGATIVE

## 2022-04-01 ENCOUNTER — Telehealth: Payer: Self-pay | Admitting: *Deleted

## 2022-04-01 NOTE — Telephone Encounter (Signed)
Followed up with patient that the office did not receive insurance card from her job. They were supposed to send it via email to [email or web address] by 03/31/2022. Patient is on her way to work and will follow up with them and call the office back.

## 2023-03-25 ENCOUNTER — Ambulatory Visit: Payer: Self-pay | Admitting: Obstetrics & Gynecology

## 2023-03-25 ENCOUNTER — Encounter: Payer: Self-pay | Admitting: Obstetrics & Gynecology

## 2023-03-25 ENCOUNTER — Other Ambulatory Visit (HOSPITAL_COMMUNITY)
Admission: RE | Admit: 2023-03-25 | Discharge: 2023-03-25 | Disposition: A | Discharge status: Home / Self Care | Payer: BC Managed Care – PPO | Source: Ambulatory Visit | Attending: Obstetrics & Gynecology | Admitting: Obstetrics & Gynecology

## 2023-03-25 VITALS — BP 147/92 | HR 73 | Ht 67.0 in | Wt 212.0 lb

## 2023-03-25 DIAGNOSIS — B9689 Other specified bacterial agents as the cause of diseases classified elsewhere: Secondary | ICD-10-CM | POA: Diagnosis not present

## 2023-03-25 DIAGNOSIS — N898 Other specified noninflammatory disorders of vagina: Secondary | ICD-10-CM

## 2023-03-25 DIAGNOSIS — Z01419 Encounter for gynecological examination (general) (routine) without abnormal findings: Secondary | ICD-10-CM | POA: Diagnosis not present

## 2023-03-25 DIAGNOSIS — R350 Frequency of micturition: Secondary | ICD-10-CM | POA: Diagnosis not present

## 2023-03-25 DIAGNOSIS — A5901 Trichomonal vulvovaginitis: Secondary | ICD-10-CM | POA: Diagnosis not present

## 2023-03-25 DIAGNOSIS — R7303 Prediabetes: Secondary | ICD-10-CM | POA: Diagnosis not present

## 2023-03-25 DIAGNOSIS — N760 Acute vaginitis: Secondary | ICD-10-CM | POA: Insufficient documentation

## 2023-03-25 DIAGNOSIS — Z113 Encounter for screening for infections with a predominantly sexual mode of transmission: Secondary | ICD-10-CM | POA: Diagnosis not present

## 2023-03-25 DIAGNOSIS — R42 Dizziness and giddiness: Secondary | ICD-10-CM

## 2023-03-25 MED ORDER — ACCU-CHEK GUIDE VI STRP
ORAL_STRIP | 12 refills | Status: AC
Start: 2023-03-25 — End: ?

## 2023-03-25 MED ORDER — ACCU-CHEK GUIDE W/DEVICE KIT
1.0000 | PACK | Freq: Four times a day (QID) | 0 refills | Status: AC
Start: 2023-03-25 — End: ?

## 2023-03-25 MED ORDER — ACCU-CHEK SOFTCLIX LANCETS MISC
1.0000 | Freq: Four times a day (QID) | 12 refills | Status: AC
Start: 2023-03-25 — End: ?

## 2023-03-25 NOTE — Progress Notes (Signed)
Subjective:     Michelle Fox is a 37 y.o. female here for a routine exam.  Current complaints: frequent urination--Seen at urgent care and ?overactive bladder.  A medication was prescribed that made her tired and she discontinued.  Pt also c/o feeling jittery if she gets hungry.  She feels like her sugar may be getting low.  Last hgb A1c is 6.0  Patint also interested in process to have BTL reversal.    Gynecologic History Patient's last menstrual period was 02/09/2023. Contraception: tubal ligation Last Pap: 2023. Results were: normal Last mammogram: n/a--due at 39.  Obstetric History OB History  Gravida Para Term Preterm AB Living  5 3 3   2 3   SAB IAB Ectopic Multiple Live Births    2     3    # Outcome Date GA Lbr Len/2nd Weight Sex Type Anes PTL Lv  5 Term 2018     Vag-Spont   LIV  4 Term 07/26/07 [redacted]w[redacted]d  9 lb (4.082 kg) M Vag-Spont   LIV     Birth Comments: System Generated. Please review and update pregnancy details.  3 IAB 06/10/03     TAB   LIV  2 Term 06/09/02 [redacted]w[redacted]d  7 lb 9.6 oz (3.447 kg)  Vag-Spont   LIV  1 IAB      TAB        The following portions of the patient's history were reviewed and updated as appropriate: allergies, current medications, past family history, past medical history, past social history, past surgical history, and problem list.  Review of Systems Pertinent items noted in HPI and remainder of comprehensive ROS otherwise negative.    Objective:     Vitals:   03/25/23 1607 03/25/23 1625  BP: (!) 152/86 (!) 147/92  Pulse: 72 73  Weight: 212 lb (96.2 kg)   Height: 5\' 7"  (1.702 m)    Vitals:  WNL General appearance: alert, cooperative and no distress  HEENT: Normocephalic, without obvious abnormality, atraumatic Eyes: negative Throat: lips, mucosa, and tongue normal; teeth and gums normal  Respiratory: Clear to auscultation bilaterally  CV: Regular rate and rhythm  Breasts:  Normal appearance, no masses or tenderness, no nipple retraction  or dimpling  GI: Soft, non-tender; bowel sounds normal; no masses,  no organomegaly  GU: External Genitalia:  Tanner V, no lesion Urethra:  No prolapse   Vagina: Pink, normal rugae, no blood or discharge  Cervix: No CMT, no lesion  Uterus:  Normal size and contour, non tender  Adnexa: Normal, no masses, non tender  Musculoskeletal: No edema, redness or tenderness in the calves or thighs  Skin: No lesions or rash  Lymphatic: Axillary adenopathy: none     Psychiatric: Normal mood and behavior        Assessment:    Healthy female exam.  Frequent urination--tried ocybutin and myrbetriq--negative urine culture on 8/4 at [facility] Possible hypoglycemia   Plan:   Frequent urination--offend referral to urogyn.  Will measure input and output with 24 hour dairy.  Hat given. Has tried 2 OAB durgs with no success.  Lightheaded/dizzy/feelings of hypoglycemia--ordered glucometer for her to check her CBG when she has these symptoms.  Vaginal discharge--base treatment on results.  Pap smear up to date Tubal reversal--pt will need visit with REI.

## 2023-03-27 LAB — CERVICOVAGINAL ANCILLARY ONLY
Bacterial Vaginitis (gardnerella): POSITIVE — AB
Candida Glabrata: NEGATIVE
Candida Vaginitis: NEGATIVE
Chlamydia: NEGATIVE
Comment: NEGATIVE
Comment: NEGATIVE
Comment: NEGATIVE
Comment: NEGATIVE
Comment: NEGATIVE
Comment: NORMAL
Neisseria Gonorrhea: NEGATIVE
Trichomonas: POSITIVE — AB

## 2023-03-31 ENCOUNTER — Other Ambulatory Visit: Payer: Self-pay | Admitting: Obstetrics & Gynecology

## 2023-03-31 MED ORDER — METRONIDAZOLE 500 MG PO TABS: 500.0000 mg | ORAL_TABLET | Freq: Two times a day (BID) | ORAL | 0 refills | Status: AC

## 2023-04-01 DIAGNOSIS — R350 Frequency of micturition: Secondary | ICD-10-CM | POA: Insufficient documentation

## 2023-04-01 DIAGNOSIS — R42 Dizziness and giddiness: Secondary | ICD-10-CM | POA: Insufficient documentation

## 2024-02-25 ENCOUNTER — Other Ambulatory Visit (HOSPITAL_COMMUNITY)
Admission: RE | Admit: 2024-02-25 | Discharge: 2024-02-25 | Disposition: A | Discharge status: Home / Self Care | Source: Ambulatory Visit | Attending: Obstetrics and Gynecology | Admitting: Obstetrics and Gynecology

## 2024-02-25 ENCOUNTER — Ambulatory Visit (INDEPENDENT_AMBULATORY_CARE_PROVIDER_SITE_OTHER)

## 2024-02-25 VITALS — BP 126/85 | HR 88 | Ht 67.0 in | Wt 206.0 lb

## 2024-02-25 DIAGNOSIS — Z113 Encounter for screening for infections with a predominantly sexual mode of transmission: Secondary | ICD-10-CM | POA: Insufficient documentation

## 2024-02-25 DIAGNOSIS — R300 Dysuria: Secondary | ICD-10-CM | POA: Diagnosis not present

## 2024-02-25 DIAGNOSIS — N898 Other specified noninflammatory disorders of vagina: Secondary | ICD-10-CM

## 2024-02-25 LAB — POCT URINALYSIS DIPSTICK
Blood, UA: NEGATIVE
Glucose, UA: NEGATIVE
Ketones, UA: NEGATIVE
Leukocytes, UA: NEGATIVE
Nitrite, UA: NEGATIVE
Protein, UA: POSITIVE — AB
Spec Grav, UA: 1.015 (ref 1.010–1.025)
Urobilinogen, UA: 1 U/dL
pH, UA: 6 (ref 5.0–8.0)

## 2024-02-25 NOTE — Progress Notes (Signed)
 SUBJECTIVE: Michelle Fox is a 38 y.o. female who complains of urinary frequency, urgency, dysuria and vaginal irritation x 10 days, without flank pain, fever, chills, or abnormal vaginal discharge or bleeding.   OBJECTIVE: Appears well, in no apparent distress.  Vital signs are normal. Urine dipstick shows positive for protein.    ASSESSMENT: Dysuria and Vaginal Irritation  PLAN: GC, chlamydia, trichomonas, BVAG, CVAG probe and urine culture sent to lab.Treatment per orders.  Call or return to clinic prn if these symptoms worsen or fail to improve as anticipated.  Silvano LELON Piano, RN

## 2024-02-27 LAB — URINE CULTURE

## 2024-03-02 ENCOUNTER — Telehealth: Payer: Self-pay | Admitting: *Deleted

## 2024-03-02 LAB — CERVICOVAGINAL ANCILLARY ONLY
Bacterial Vaginitis (gardnerella): NEGATIVE
Candida Glabrata: NEGATIVE
Candida Vaginitis: NEGATIVE
Chlamydia: NEGATIVE
Comment: NEGATIVE
Comment: NEGATIVE
Comment: NEGATIVE
Comment: NEGATIVE
Comment: NEGATIVE
Comment: NORMAL
Neisseria Gonorrhea: NEGATIVE
Trichomonas: NEGATIVE

## 2024-03-02 NOTE — Telephone Encounter (Signed)
 Patient would like a return phone call about test results.

## 2024-03-02 NOTE — Progress Notes (Signed)
 RN called [facility] and added additional STI testing as resulted only showed for BV and Yeast. Per Ronal, able to add on testing, results pending.  Silvano LELON Piano, RN

## 2024-03-03 ENCOUNTER — Telehealth: Payer: Self-pay

## 2024-03-03 NOTE — Telephone Encounter (Signed)
 Patient called into office and left message about wanting a call about results. RN informed patient of normal results. Pt reported still having some irritation. RN asked if patient had tried any new soaps or detergents. Pt reported had recently switched from [place] to [employer] and was thinking that could be the cause. RN advised if symptoms do not improve then could schedule patient for an appointment. Patient thanked Charity fundraiser for the call and had no further questions.  Silvano LELON Piano, RN

## 2024-04-18 ENCOUNTER — Encounter: Payer: Self-pay | Admitting: Obstetrics & Gynecology

## 2024-04-18 ENCOUNTER — Ambulatory Visit: Admitting: Obstetrics & Gynecology

## 2024-04-18 VITALS — BP 130/84 | HR 80 | Ht 67.0 in | Wt 207.0 lb

## 2024-04-18 DIAGNOSIS — R350 Frequency of micturition: Secondary | ICD-10-CM

## 2024-04-18 DIAGNOSIS — M542 Cervicalgia: Secondary | ICD-10-CM | POA: Diagnosis not present

## 2024-04-18 NOTE — Progress Notes (Signed)
  Subjective:     Michelle Fox is a 38 y.o. female here for a routine exam.  Current complaints: none--mother dx with bone cancer.  Frequent urination continues--failed some OAB meds in past--made her sleepy.    Gynecologic History Patient's last menstrual period was 04/10/2024 (exact date). Contraception: tubal ligation Last pap smear (date and result):03/28/22- negative  Last mammogram (date and result):N/A Last colon screening (date and result):N/A Brush:Yes Floss:yes Seatbelts: yes Sunscreen: no   Obstetric History OB History  Gravida Para Term Preterm AB Living  5 3 3  2 3   SAB IAB Ectopic Multiple Live Births   2   3    # Outcome Date GA Lbr Len/2nd Weight Sex Type Anes PTL Lv  5 Term 2018     Vag-Spont   LIV  4 Term 07/26/07 [redacted]w[redacted]d  9 lb (4.082 kg) M Vag-Spont   LIV     Birth Comments: System Generated. Please review and update pregnancy details.  3 IAB 06/10/03     TAB   LIV  2 Term 06/09/02 [redacted]w[redacted]d  7 lb 9.6 oz (3.447 kg)  Vag-Spont   LIV  1 IAB      TAB        The following portions of the patient's history were reviewed and updated as appropriate: allergies, current medications, past family history, past medical history, past social history, past surgical history, and problem list.  Review of Systems Pertinent items noted in HPI and remainder of comprehensive ROS otherwise negative.    Objective:     Vitals:   04/18/24 0843  BP: 130/84  Pulse: 80  Weight: 207 lb (93.9 kg)  Height: 5' 7 (1.702 m)   Vitals:  WNL General appearance: alert, cooperative and no distress  HEENT: Normocephalic, without obvious abnormality, atraumatic Eyes: negative Throat: lips, mucosa, and tongue normal; teeth and gums normal  Respiratory: Clear to auscultation bilaterally  CV: Regular rate and rhythm  Breasts:  Normal appearance, no masses or tenderness, no nipple retraction or dimpling  GI: Soft, non-tender; bowel sounds normal; no masses,  no organomegaly  GU: Declined  pelvic exam--had one at PCP  Vagina: Declined pelvic exam--had one at PCP  Cervix: Declined pelvic exam--had one at PCP  Uterus:  Declined pelvic exam--had one at PCP  Adnexa: Declined pelvic exam--had one at PCP  Musculoskeletal: No edema, redness or tenderness in the calves or thighs; increase tension over masseter and SCM  Skin: No lesions or rash  Lymphatic: Axillary adenopathy: none     Psychiatric: Normal mood and behavior        Assessment:    Healthy female exam.    Plan:    1.  Pap up to date 2.  Mammogram at 39 3.  Colon cancer screening at 44 4.  Urinary frequency continues -- would like referral to urogyn; meds made her drowsy. 5. Muscle tension in mouth--grinding--getting night guard (has braces); refer to PT for eval and treat.

## 2024-04-26 NOTE — Therapy (Deleted)
 OUTPATIENT PHYSICAL THERAPY CERVICAL EVALUATION   Patient Name: Michelle Fox MRN: [id number] DOB:16-Nov-1985, 38 y.o., female Today's Date: 04/26/2024  END OF SESSION:   Past Medical History:  Diagnosis Date   Medical history non-contributory    Past Surgical History:  Procedure Laterality Date   LAPAROSCOPIC TUBAL LIGATION Bilateral 03/24/2017   Procedure: LAPAROSCOPIC TUBAL LIGATION Filshie Clips;  Surgeon: Corene Coy, MD;  Location: [facility];  Service: Gynecology;  Laterality: Bilateral;   NO PAST SURGERIES     WISDOM TOOTH EXTRACTION  2017   Patient Active Problem List   Diagnosis Date Noted   Frequent urination 04/01/2023   Episodic lightheadedness 04/01/2023   Seborrheic dermatitis of scalp 12/16/2021   Umbilical mass 08/05/2016    PCP: No PCP per patient   REFERRING PROVIDER: Dr Burnard VEAR Pate  REFERRING DIAG: Neck pain   THERAPY DIAG:  No diagnosis found.  Rationale for Evaluation and Treatment: Rehabilitation  ONSET DATE: ***  SUBJECTIVE:                                                                                                                                                                                                         SUBJECTIVE STATEMENT: Patient reports    MD note indicates that patient has muscle tension in mouth - grinding - getting night guard (has braces)   Hand dominance: {MISC; OT HAND DOMINANCE:[id number]}  PERTINENT HISTORY:  ***  PAIN:  Are you having pain? Yes: NPRS scale: *** Pain location: *** Pain description: *** Aggravating factors: *** Relieving factors: ***  PRECAUTIONS: {Therapy precautions:24002}  RED FLAGS: None     WEIGHT BEARING RESTRICTIONS: No  FALLS:  Has patient fallen in last 6 months? {fallsyesno:27318}  LIVING ENVIRONMENT: Lives with: {[facility] lives with:25569::lives with their family} Lives in: {Lives in:[address]70} Stairs: {opstairs:27293} Has following equipment at home:  {Assistive devices:23999}  OCCUPATION: ***  PLOF: Independent  PATIENT GOALS: ***  NEXT MD VISIT: none scheduled   OBJECTIVE:  Note: Objective measures were completed at Evaluation unless otherwise noted.  DIAGNOSTIC FINDINGS:  None available   PATIENT SURVEYS:  {rehab surveys:24030}  COGNITION: Overall cognitive status: Within functional limits for tasks assessed  SENSATION: {sensation:27233}  POSTURE: {posture:25561}  PALPATION: ***   CERVICAL ROM:   Active ROM A/PROM (deg) eval  Flexion   Extension   Right lateral flexion   Left lateral flexion   Right rotation   Left rotation    (Blank rows = not tested)  UPPER EXTREMITY ROM:  Active ROM Right eval Left eval  Shoulder flexion  Shoulder extension    Shoulder abduction    Shoulder adduction    Shoulder extension    Shoulder internal rotation    Shoulder external rotation    Elbow flexion    Elbow extension    Wrist flexion    Wrist extension    Wrist ulnar deviation    Wrist radial deviation    Wrist pronation    Wrist supination     (Blank rows = not tested)  UPPER EXTREMITY MMT:  MMT Right eval Left eval  Shoulder flexion    Shoulder extension    Shoulder abduction    Shoulder adduction    Shoulder extension    Shoulder internal rotation    Shoulder external rotation    Middle trapezius    Lower trapezius    Elbow flexion    Elbow extension    Wrist flexion    Wrist extension    Wrist ulnar deviation    Wrist radial deviation    Wrist pronation    Wrist supination    Grip strength     (Blank rows = not tested)  CERVICAL SPECIAL TESTS:  Upper limb tension test (ULTT): {pos/neg:25243}, Spurling's test: {pos/neg:25243}, and Distraction test: {pos/neg:25243}  TREATMENT DATE: ***                                                                                                                                 PATIENT EDUCATION:  Education details: POC; HEP  Person  educated: Patient Education method: Programmer, Multimedia, Facilities Manager, Actor cues, Verbal cues, and Handouts Education comprehension: verbalized understanding, returned demonstration, verbal cues required, tactile cues required, and needs further education  HOME EXERCISE PROGRAM: ***  ASSESSMENT:  CLINICAL IMPRESSION: Patient is a  38 y.o. female who was seen today for physical therapy evaluation and treatment for cervical pain.   OBJECTIVE IMPAIRMENTS: {opptimpairments:25111}.   ACTIVITY LIMITATIONS: {activitylimitations:27494}  PARTICIPATION LIMITATIONS: {participationrestrictions:25113}  PERSONAL FACTORS: {Personal factors:25162} are also affecting patient's functional outcome.   REHAB POTENTIAL: Good  CLINICAL DECISION MAKING: Evolving/moderate complexity  EVALUATION COMPLEXITY: Moderate   GOALS: Goals reviewed with patient? Yes  SHORT TERM GOALS: Target date: ***  Independent in initial HEP  Baseline:  Goal status: INITIAL  2.  *** Baseline:  Goal status: INITIAL  3.  *** Baseline:  Goal status: INITIAL   LONG TERM GOALS: Target date: ***  *** Baseline:  Goal status: INITIAL  2.  *** Baseline:  Goal status: INITIAL  3.  *** Baseline:  Goal status: INITIAL  4.  *** Baseline:  Goal status: INITIAL  5.  *** Baseline:  Goal status: INITIAL  6.  *** Baseline:  Goal status: INITIAL   PLAN:  PT FREQUENCY: 2x/week  PT DURATION: 8 weeks  PLANNED INTERVENTIONS: 97164- PT Re-evaluation, 97110-Therapeutic exercises, 97530- Therapeutic activity, 97112- Neuromuscular re-education, 97535- Self Care, 02859- Manual therapy, Patient/Family education, Taping, and Joint mobilization  PLAN FOR NEXT SESSION: review and progress exercises;  postural and ergonomic correction/education; manual work and modalities as indicated    Tavyn Kurka P Westlee Devita, PT 04/26/2024, 12:01 PM

## 2024-04-28 ENCOUNTER — Ambulatory Visit: Attending: Obstetrics & Gynecology | Admitting: Rehabilitative and Restorative Service Providers"

## 2024-05-03 ENCOUNTER — Ambulatory Visit: Admitting: Rehabilitative and Restorative Service Providers"

## 2024-05-11 ENCOUNTER — Ambulatory Visit: Attending: Obstetrics & Gynecology

## 2024-05-11 NOTE — Therapy (Deleted)
 OUTPATIENT PHYSICAL THERAPY CERVICAL EVALUATION   Patient Name: Kimarie Coor MRN: [id number] DOB:02-Dec-1985, 38 y.o., female Today's Date: 05/11/2024  END OF SESSION:   Past Medical History:  Diagnosis Date   Medical history non-contributory    Past Surgical History:  Procedure Laterality Date   LAPAROSCOPIC TUBAL LIGATION Bilateral 03/24/2017   Procedure: LAPAROSCOPIC TUBAL LIGATION Filshie Clips;  Surgeon: Corene Coy, MD;  Location: [facility];  Service: Gynecology;  Laterality: Bilateral;   NO PAST SURGERIES     WISDOM TOOTH EXTRACTION  2017   Patient Active Problem List   Diagnosis Date Noted   Frequent urination 04/01/2023   Episodic lightheadedness 04/01/2023   Seborrheic dermatitis of scalp 12/16/2021   Umbilical mass 08/05/2016    PCP: No PCP  REFERRING PROVIDER: Burnard Pate, MD  REFERRING DIAG: Diagnosis  M54.2 (ICD-10-CM) - Neck pain    THERAPY DIAG:  No diagnosis found.  Rationale for Evaluation and Treatment: Rehabilitation  ONSET DATE: ***  SUBJECTIVE:                                                                                                                                                                                                         SUBJECTIVE STATEMENT: *** Hand dominance: {MISC; OT HAND DOMINANCE:[phone]}  PERTINENT HISTORY:  Episodic lightheadedness Frequent urination (2024)  PAIN:  Are you having pain? Yes: NPRS scale: *** Pain location: *** Pain description: *** Aggravating factors: *** Relieving factors: ***  PRECAUTIONS: None  RED FLAGS: None     WEIGHT BEARING RESTRICTIONS: No  FALLS:  Has patient fallen in last 6 months? {fallsyesno:27318}  LIVING ENVIRONMENT: Lives with: {[facility] lives with:25569::lives with their family} Lives in: {Lives in:[address]} Stairs: {opstairs:27293} Has following equipment at home: {Assistive devices:23999}  OCCUPATION: ***  PLOF: {PLOF:24004}  PATIENT GOALS:  ***  NEXT MD VISIT: ***  OBJECTIVE:  Note: Objective measures were completed at Evaluation unless otherwise noted.    PATIENT SURVEYS:  NDI:  NECK DISABILITY INDEX  Date: 05/11/24 Score  Pain intensity {NDI-1:32931}  2. Personal care (washing, dressing, etc.) {NDI-2:32932}  3. Lifting {NDI-3:32933}  4. Reading {NDI-4:32934}  5. Headaches {NDI-5:32935}  6. Concentration {NDI-6:32936}  7. Work {NDI-7:32937}  8. Driving {WIP-1:67061}  9. Sleeping {NDI-9:32939}  10. Recreation {NDI-10:32940}  Total ***/50   Minimum Detectable Change (90% confidence): 5 points or 10% points  COGNITION: Overall cognitive status: Within functional limits for tasks assessed  SENSATION: {sensation:27233}  POSTURE: {posture:25561}  PALPATION: ***   CERVICAL ROM:   Active ROM A/PROM (deg) eval  Flexion   Extension  Right lateral flexion   Left lateral flexion   Right rotation   Left rotation    (Blank rows = not tested)  UPPER EXTREMITY ROM:  Active ROM Right eval Left eval  Shoulder flexion    Shoulder extension    Shoulder abduction    Shoulder adduction    Shoulder extension    Shoulder internal rotation    Shoulder external rotation    Elbow flexion    Elbow extension    Wrist flexion    Wrist extension    Wrist ulnar deviation    Wrist radial deviation    Wrist pronation    Wrist supination     (Blank rows = not tested)  UPPER EXTREMITY MMT:  MMT Right eval Left eval  Shoulder flexion    Shoulder extension    Shoulder abduction    Shoulder adduction    Shoulder extension    Shoulder internal rotation    Shoulder external rotation    Middle trapezius    Lower trapezius    Elbow flexion    Elbow extension    Wrist flexion    Wrist extension    Wrist ulnar deviation    Wrist radial deviation    Wrist pronation    Wrist supination    Grip strength     (Blank rows = not tested)  CERVICAL SPECIAL TESTS:  {Cervical special tests:25246}  FUNCTIONAL  TESTS:  5 times sit to stand: *** Timed up and go (TUG): *** SLStance Rt 20 sec, Lt 20 sec   TREATMENT DATE:  [facility]:                                                DATE: 05/11/24 Therapeutic Exercise: See HEP                                                                                                                                  PATIENT EDUCATION:  Education details: HEP and POC Person educated: Patient Education method: Programmer, Multimedia, Demonstration, Actor cues, Verbal cues, and Handouts Education comprehension: verbalized understanding, returned demonstration, verbal cues required, tactile cues required, and needs further education  HOME EXERCISE PROGRAM: ***  ASSESSMENT:  CLINICAL IMPRESSION: Patient is a 38 y.o. female who was seen today for physical therapy evaluation and treatment for neck pain.   OBJECTIVE IMPAIRMENTS: decreased activity tolerance, decreased balance, decreased coordination, decreased endurance, decreased mobility, decreased ROM, decreased strength, increased fascial restrictions, increased muscle spasms, impaired flexibility, impaired UE functional use, improper body mechanics, postural dysfunction, and pain.   ACTIVITY LIMITATIONS: carrying, lifting, bending, sitting, standing, squatting, stairs, transfers, bathing, and dressing  PARTICIPATION LIMITATIONS: meal prep, cleaning, laundry, driving, community activity, and occupation  PERSONAL FACTORS: Past/current experiences, Social background, and 1 comorbidity: Episodic lightheadedness are also  affecting patient's functional outcome.   REHAB POTENTIAL: {rehabpotential:25112}  CLINICAL DECISION MAKING: {clinical decision making:25114}  EVALUATION COMPLEXITY: {Evaluation complexity:25115}   GOALS: Goals reviewed with patient? Yes  SHORT TERM GOALS: Target date: 06/11/24  Pt will be efficient with initial HEP so she has increased independence for everyday activities.   Baseline:  Goal status: INITIAL  2.  *** Baseline:  Goal status: INITIAL  3.  *** Baseline:  Goal status: INITIAL    LONG TERM GOALS: Target date: 07/07/23  Pt will be efficient with advanced HEP so she has increased independence for everyday activities.  Baseline:  Goal status: INITIAL  2.  Pt will improve NDI score by at least 10 % or more so she is able to  Baseline:  Goal status: INITIAL  3.  *** Baseline:  Goal status: INITIAL     PLAN:  PT FREQUENCY: 2x/week  PT DURATION: 8 weeks  PLANNED INTERVENTIONS: 97164- PT Re-evaluation, 97750- Physical Performance Testing, 97110-Therapeutic exercises, 97530- Therapeutic activity, [id number]- Neuromuscular re-education, 97535- Self Care, 02859- Manual therapy, [id number]- Gait training, [phone]- Electrical stimulation (manual), [id number]- Ultrasound, 79439 (1-2 muscles), 20561 (3+ muscles)- Dry Needling, Patient/Family education, Balance training, Stair training, Joint mobilization, Spinal mobilization, Cryotherapy, and Moist heat  PLAN FOR NEXT SESSION: ***   Lavanda Cleverly, Student-PT 05/11/2024, 7:34 AM
